# Patient Record
Sex: Female | Born: 1960 | Race: White | Hispanic: No | Marital: Married | Smoking: Never smoker
Health system: Southern US, Community
[De-identification: ages and names within clinical notes are randomized; demographics above are authoritative.]

## PROBLEM LIST (undated history)

## (undated) HISTORY — PX: AUGMENTATION MAMMAPLASTY: SUR837

---

## 1999-11-10 ENCOUNTER — Other Ambulatory Visit: Admission: RE | Admit: 1999-11-10 | Discharge: 1999-11-10 | Payer: Self-pay | Admitting: Obstetrics and Gynecology

## 1999-12-10 ENCOUNTER — Encounter: Payer: Self-pay | Admitting: Obstetrics and Gynecology

## 1999-12-10 ENCOUNTER — Encounter: Admission: RE | Admit: 1999-12-10 | Discharge: 1999-12-10 | Payer: Self-pay | Admitting: Obstetrics and Gynecology

## 2000-11-02 ENCOUNTER — Inpatient Hospital Stay (HOSPITAL_COMMUNITY): Admission: AD | Admit: 2000-11-02 | Discharge: 2000-11-04 | Payer: Self-pay | Admitting: Obstetrics and Gynecology

## 2000-12-01 ENCOUNTER — Other Ambulatory Visit: Admission: RE | Admit: 2000-12-01 | Discharge: 2000-12-01 | Payer: Self-pay | Admitting: Obstetrics and Gynecology

## 2002-03-27 ENCOUNTER — Other Ambulatory Visit: Admission: RE | Admit: 2002-03-27 | Discharge: 2002-03-27 | Payer: Self-pay | Admitting: Obstetrics and Gynecology

## 2002-07-03 ENCOUNTER — Other Ambulatory Visit: Admission: RE | Admit: 2002-07-03 | Discharge: 2002-07-03 | Payer: Self-pay | Admitting: Obstetrics and Gynecology

## 2003-05-16 ENCOUNTER — Other Ambulatory Visit: Admission: RE | Admit: 2003-05-16 | Discharge: 2003-05-16 | Payer: Self-pay | Admitting: Obstetrics and Gynecology

## 2003-09-12 ENCOUNTER — Other Ambulatory Visit: Admission: RE | Admit: 2003-09-12 | Discharge: 2003-09-12 | Payer: Self-pay | Admitting: Obstetrics and Gynecology

## 2004-11-19 ENCOUNTER — Ambulatory Visit: Payer: Self-pay

## 2007-08-16 ENCOUNTER — Ambulatory Visit: Payer: Self-pay

## 2008-12-09 ENCOUNTER — Ambulatory Visit: Payer: Self-pay | Admitting: Obstetrics and Gynecology

## 2010-04-13 ENCOUNTER — Ambulatory Visit: Payer: Self-pay | Admitting: Obstetrics and Gynecology

## 2011-09-16 ENCOUNTER — Ambulatory Visit: Payer: Self-pay | Admitting: Obstetrics and Gynecology

## 2011-09-26 ENCOUNTER — Ambulatory Visit: Payer: Self-pay | Admitting: Obstetrics and Gynecology

## 2013-01-12 DIAGNOSIS — E538 Deficiency of other specified B group vitamins: Secondary | ICD-10-CM | POA: Insufficient documentation

## 2013-02-13 ENCOUNTER — Ambulatory Visit: Payer: Self-pay | Admitting: Obstetrics and Gynecology

## 2014-07-17 ENCOUNTER — Ambulatory Visit: Payer: Self-pay | Admitting: Obstetrics and Gynecology

## 2015-08-07 ENCOUNTER — Other Ambulatory Visit: Payer: Self-pay | Admitting: Obstetrics and Gynecology

## 2015-08-07 DIAGNOSIS — Z780 Asymptomatic menopausal state: Secondary | ICD-10-CM

## 2015-08-07 DIAGNOSIS — Z13820 Encounter for screening for osteoporosis: Secondary | ICD-10-CM

## 2015-08-07 DIAGNOSIS — Z1231 Encounter for screening mammogram for malignant neoplasm of breast: Secondary | ICD-10-CM

## 2015-08-27 ENCOUNTER — Ambulatory Visit
Admission: RE | Admit: 2015-08-27 | Discharge: 2015-08-27 | Disposition: A | Discharge status: Home / Self Care | Payer: BLUE CROSS/BLUE SHIELD | Source: Ambulatory Visit | Attending: Obstetrics and Gynecology | Admitting: Obstetrics and Gynecology

## 2015-08-27 ENCOUNTER — Other Ambulatory Visit: Payer: Self-pay | Admitting: Obstetrics and Gynecology

## 2015-08-27 DIAGNOSIS — Z9882 Breast implant status: Secondary | ICD-10-CM | POA: Diagnosis not present

## 2015-08-27 DIAGNOSIS — Z13820 Encounter for screening for osteoporosis: Secondary | ICD-10-CM

## 2015-08-27 DIAGNOSIS — Z780 Asymptomatic menopausal state: Secondary | ICD-10-CM

## 2015-08-27 DIAGNOSIS — Z1231 Encounter for screening mammogram for malignant neoplasm of breast: Secondary | ICD-10-CM | POA: Diagnosis not present

## 2016-02-11 DIAGNOSIS — E034 Atrophy of thyroid (acquired): Secondary | ICD-10-CM | POA: Insufficient documentation

## 2016-02-11 DIAGNOSIS — E785 Hyperlipidemia, unspecified: Secondary | ICD-10-CM | POA: Insufficient documentation

## 2016-12-08 ENCOUNTER — Other Ambulatory Visit: Payer: Self-pay | Admitting: Obstetrics and Gynecology

## 2017-04-04 DIAGNOSIS — E041 Nontoxic single thyroid nodule: Secondary | ICD-10-CM | POA: Insufficient documentation

## 2018-07-11 ENCOUNTER — Other Ambulatory Visit: Payer: Self-pay | Admitting: Internal Medicine

## 2018-07-11 DIAGNOSIS — Z1231 Encounter for screening mammogram for malignant neoplasm of breast: Secondary | ICD-10-CM

## 2018-07-27 ENCOUNTER — Ambulatory Visit
Admission: RE | Admit: 2018-07-27 | Discharge: 2018-07-27 | Disposition: A | Discharge status: Home / Self Care | Payer: BLUE CROSS/BLUE SHIELD | Source: Ambulatory Visit | Attending: Internal Medicine | Admitting: Internal Medicine

## 2018-07-27 ENCOUNTER — Encounter: Payer: Self-pay | Admitting: Radiology

## 2018-07-27 DIAGNOSIS — Z1231 Encounter for screening mammogram for malignant neoplasm of breast: Secondary | ICD-10-CM | POA: Diagnosis present

## 2018-07-31 ENCOUNTER — Inpatient Hospital Stay
Admission: RE | Admit: 2018-07-31 | Discharge: 2018-07-31 | Disposition: A | Discharge status: Home / Self Care | Payer: Self-pay | Source: Ambulatory Visit | Attending: *Deleted | Admitting: *Deleted

## 2018-07-31 ENCOUNTER — Other Ambulatory Visit: Payer: Self-pay | Admitting: *Deleted

## 2018-07-31 DIAGNOSIS — Z9289 Personal history of other medical treatment: Secondary | ICD-10-CM

## 2019-10-24 ENCOUNTER — Other Ambulatory Visit: Payer: Self-pay | Admitting: Obstetrics and Gynecology

## 2019-12-11 ENCOUNTER — Other Ambulatory Visit: Payer: Self-pay | Admitting: Obstetrics and Gynecology

## 2019-12-11 DIAGNOSIS — Z1231 Encounter for screening mammogram for malignant neoplasm of breast: Secondary | ICD-10-CM

## 2020-01-09 ENCOUNTER — Ambulatory Visit
Admission: RE | Admit: 2020-01-09 | Discharge: 2020-01-09 | Disposition: A | Discharge status: Home / Self Care | Payer: BLUE CROSS/BLUE SHIELD | Source: Ambulatory Visit | Attending: Obstetrics and Gynecology | Admitting: Obstetrics and Gynecology

## 2020-01-09 DIAGNOSIS — Z1231 Encounter for screening mammogram for malignant neoplasm of breast: Secondary | ICD-10-CM | POA: Insufficient documentation

## 2020-01-14 ENCOUNTER — Other Ambulatory Visit: Payer: Self-pay | Admitting: Obstetrics and Gynecology

## 2020-01-14 DIAGNOSIS — R928 Other abnormal and inconclusive findings on diagnostic imaging of breast: Secondary | ICD-10-CM

## 2020-01-14 DIAGNOSIS — N6310 Unspecified lump in the right breast, unspecified quadrant: Secondary | ICD-10-CM

## 2020-01-24 ENCOUNTER — Ambulatory Visit
Admission: RE | Admit: 2020-01-24 | Discharge: 2020-01-24 | Disposition: A | Discharge status: Home / Self Care | Payer: Self-pay | Source: Ambulatory Visit | Attending: Obstetrics and Gynecology | Admitting: Obstetrics and Gynecology

## 2020-01-24 DIAGNOSIS — N6310 Unspecified lump in the right breast, unspecified quadrant: Secondary | ICD-10-CM

## 2020-01-24 DIAGNOSIS — R928 Other abnormal and inconclusive findings on diagnostic imaging of breast: Secondary | ICD-10-CM

## 2020-07-15 ENCOUNTER — Other Ambulatory Visit: Payer: Self-pay | Admitting: Dermatology

## 2020-07-15 ENCOUNTER — Other Ambulatory Visit: Payer: Self-pay

## 2020-07-15 ENCOUNTER — Ambulatory Visit (INDEPENDENT_AMBULATORY_CARE_PROVIDER_SITE_OTHER): Payer: Self-pay | Admitting: Dermatology

## 2020-07-15 DIAGNOSIS — L738 Other specified follicular disorders: Secondary | ICD-10-CM

## 2020-07-15 DIAGNOSIS — I8310 Varicose veins of unspecified lower extremity with inflammation: Secondary | ICD-10-CM

## 2020-07-15 DIAGNOSIS — Z1283 Encounter for screening for malignant neoplasm of skin: Secondary | ICD-10-CM

## 2020-07-15 DIAGNOSIS — D485 Neoplasm of uncertain behavior of skin: Secondary | ICD-10-CM

## 2020-07-15 DIAGNOSIS — L821 Other seborrheic keratosis: Secondary | ICD-10-CM

## 2020-07-15 DIAGNOSIS — D1800 Hemangioma unspecified site: Secondary | ICD-10-CM

## 2020-07-15 DIAGNOSIS — L578 Other skin changes due to chronic exposure to nonionizing radiation: Secondary | ICD-10-CM

## 2020-07-15 DIAGNOSIS — D229 Melanocytic nevi, unspecified: Secondary | ICD-10-CM

## 2020-07-15 DIAGNOSIS — L814 Other melanin hyperpigmentation: Secondary | ICD-10-CM

## 2020-07-15 NOTE — Progress Notes (Deleted)
Follow-Up Visit   Subjective  Deborah Macias is a 59 y.o. female who presents for the following: Annual Exam. The patient presents for Total-Body Skin Exam (TBSE) for skin cancer screening and mole check.  The following portions of the chart were reviewed this encounter and updated as appropriate:  Allergies  Meds  Problems  Med Hx  Surg Hx  Fam Hx     Review of Systems:  No other skin or systemic complaints except as noted in HPI or Assessment and Plan.  Objective  Well appearing patient in no apparent distress; mood and affect are within normal limits.  A full examination was performed including scalp, head, eyes, ears, nose, lips, neck, chest, axillae, abdomen, back, buttocks, bilateral upper extremities, bilateral lower extremities, hands, feet, fingers, toes, fingernails, and toenails. All findings within normal limits unless otherwise noted below.  Objective  Face: Small yellow papules with a central dell.   Objective  LUQA: 0.4 cm Firm pink/brown papulenodule with dimple sign.   Assessment & Plan  Sebaceous hyperplasia Face Benign, observe. Discussed fee of $60 to treat first lesion and $15 for each there after.    Neoplasm of uncertain behavior of skin LUQA  Skin / nail biopsy Type of biopsy: punch   Informed consent: discussed and consent obtained   Timeout: patient name, date of birth, surgical site, and procedure verified   Procedure prep:  Patient was prepped and draped in usual sterile fashion (the patient was cleaned and prepped) Prep type:  Isopropyl alcohol Anesthesia: the lesion was anesthetized in a standard fashion   Anesthetic:  1% lidocaine w/ epinephrine 1-100,000 buffered w/ 8.4% NaHCO3 Punch size:  3 mm Suture size:  4-0 Suture type: nylon   Hemostasis achieved with: suture, pressure and aluminum chloride   Outcome: patient tolerated procedure well   Post-procedure details: sterile dressing applied and wound care instructions given     Dressing type: bandage, petrolatum and pressure dressing    Specimen 1 - Surgical pathology Differential Diagnosis: D48.5 r/o dermatofibroma vs other Check Margins: No Firm pink/brown papulenodule with dimple sign.  Granulomatous tick bite reaction versus dermatofibroma from tick bite - Discussed punch excision if bothersome.  Punch excision performed today   Lentigines - Scattered tan macules - Discussed due to sun exposure - Benign, observe - Call for any changes  Seborrheic Keratoses - Stuck-on, waxy, tan-brown papules and plaques  - Discussed benign etiology and prognosis. - Observe - Call for any changes  Melanocytic Nevi - Tan-brown and/or pink-flesh-colored symmetric macules and papules - Benign appearing on exam today - Observation - Call clinic for new or changing moles - Recommend daily use of broad spectrum spf 30+ sunscreen to sun-exposed areas.   Hemangiomas - Red papules - Discussed benign nature - Observe - Call for any changes  Actinic Damage - diffuse scaly erythematous macules with underlying dyspigmentation - Recommend daily broad spectrum sunscreen SPF 30+ to sun-exposed areas, reapply every 2 hours as needed.  - Call for new or changing lesions.  Spider Veins - Dilated blue, purple or red veins at the lower extremities - Reassured - These can be treated by sclerotherapy (a procedure to inject a medicine into the veins to make them disappear) if desired, but the treatment is not covered by insurance  Skin cancer screening performed today.  Return in about 1 year (around 07/15/2021) for TBSE.  Luther Redo, CMA, am acting as scribe for Sarina Ser, MD .  Documentation: I have reviewed  the above documentation for accuracy and completeness, and I agree with the above.  Sarina Ser, MD

## 2020-07-15 NOTE — Patient Instructions (Signed)

## 2020-07-20 ENCOUNTER — Encounter: Payer: Self-pay | Admitting: Dermatology

## 2020-07-22 ENCOUNTER — Other Ambulatory Visit: Payer: Self-pay

## 2020-07-22 ENCOUNTER — Telehealth: Payer: Self-pay

## 2020-07-22 IMAGING — MG MM DIGITAL DIAGNOSTIC UNILAT*R* IMPLANT W/ TOMO W/ CAD
5 series · 6 of 13 positions shown · non-contrast
Comparison: 01/09/2020 and multiple prior studies including
02/13/2013

CLINICAL DATA: Patient returns after screening study for evaluation
of a possible RIGHT breast mass

EXAM:
DIGITAL DIAGNOSTIC RIGHT MAMMOGRAM WITH IMPLANTS AND TOMO
ULTRASOUND RIGHT BREAST
The patient has retropectoral implants. Standard and implant
displaced views were performed.

[R ML]
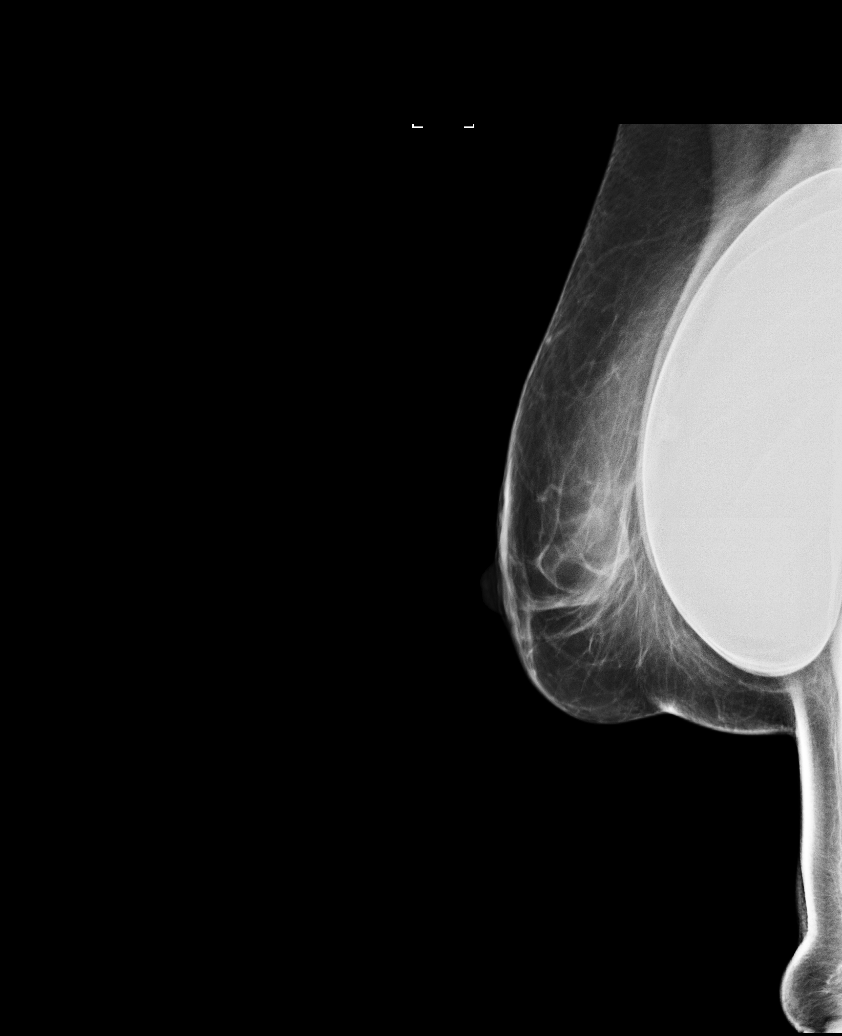

[R ML synth-2D]
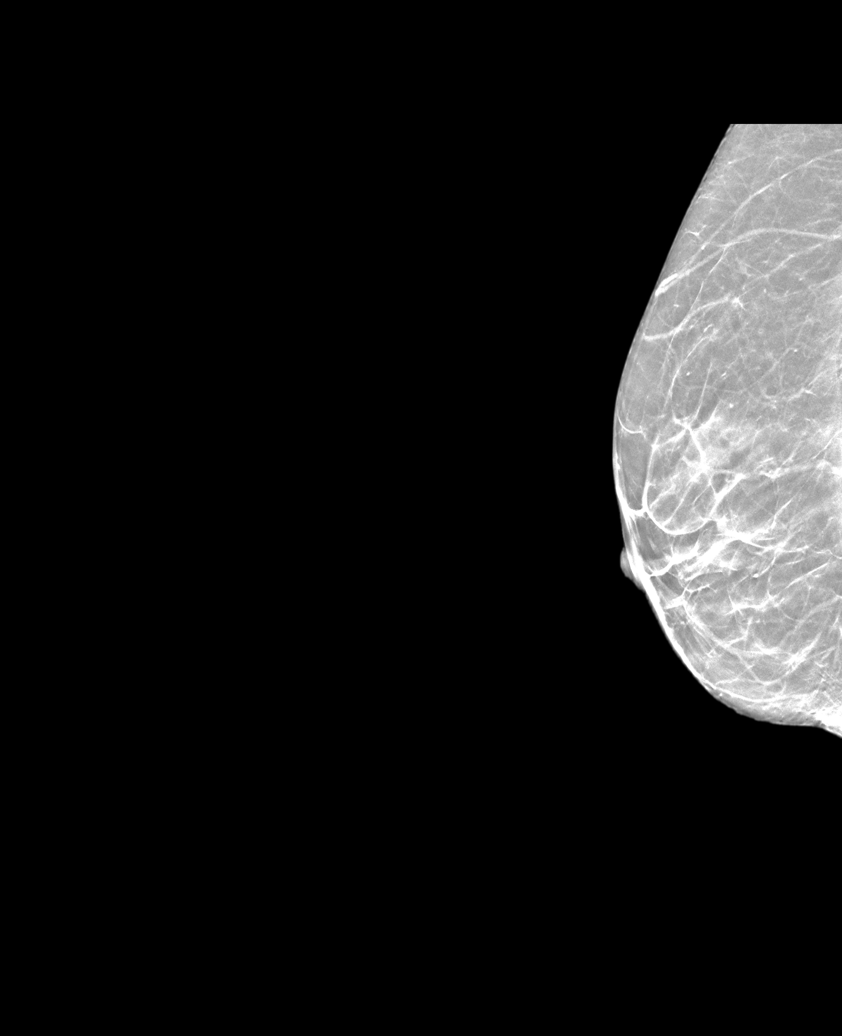

[R CC synth-2D]
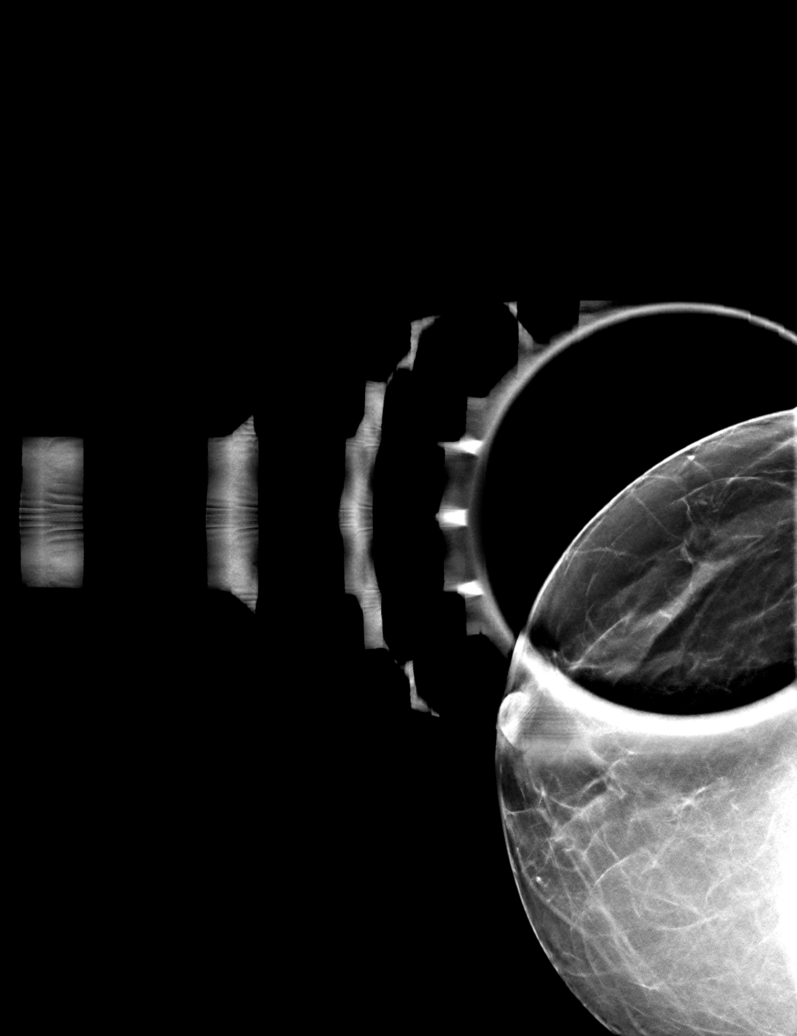

[R CCID BREAST TOMOSYNTHESIS IMAGE tomo · 2 of 38 frames shown]
[frame 13/38]
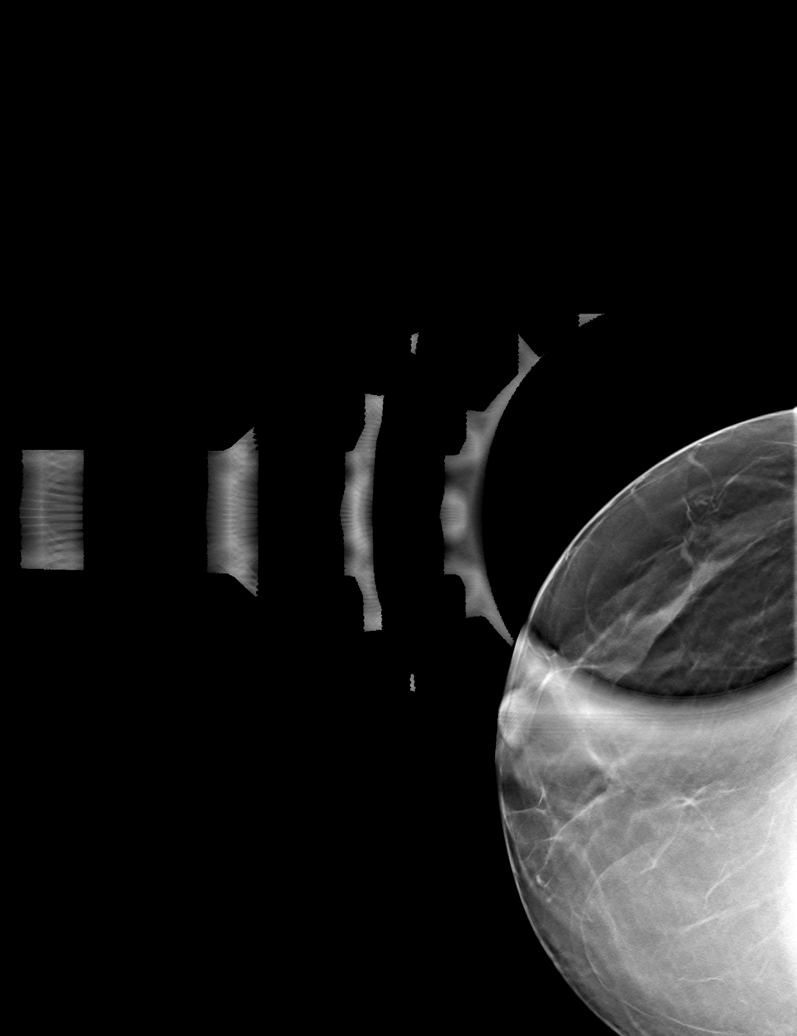
[frame 19/38]
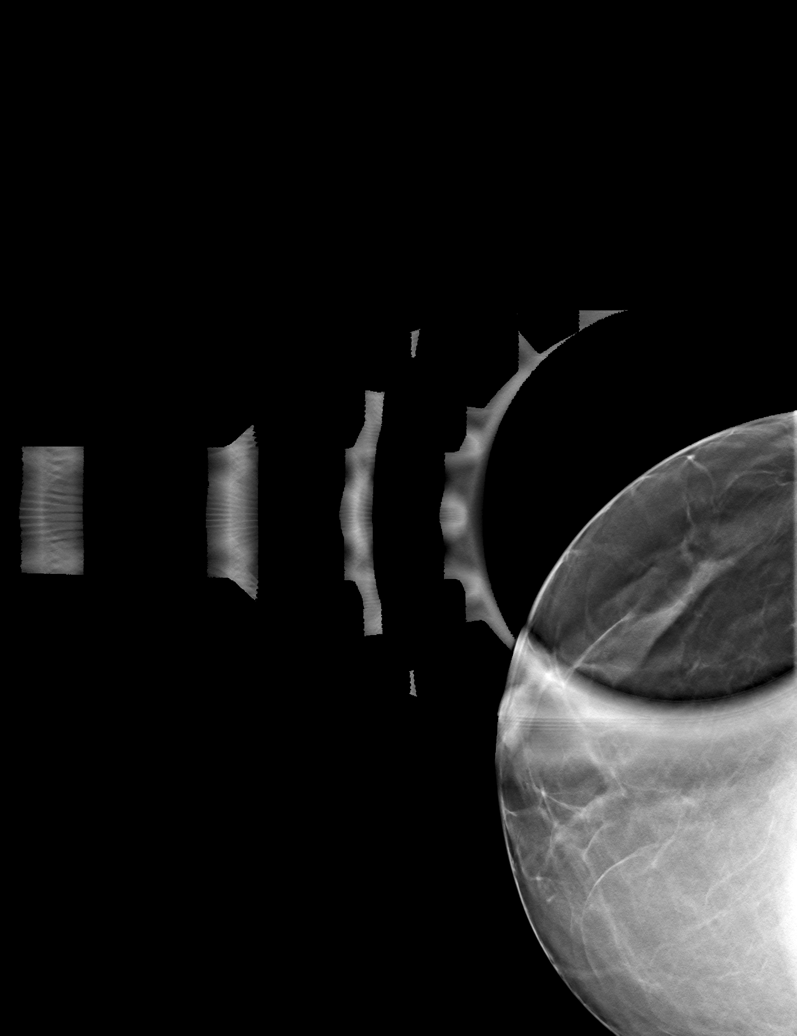

[R MLID BREAST TOMOSYNTHESIS IMAGE tomo · tomo slice 23/46.0]
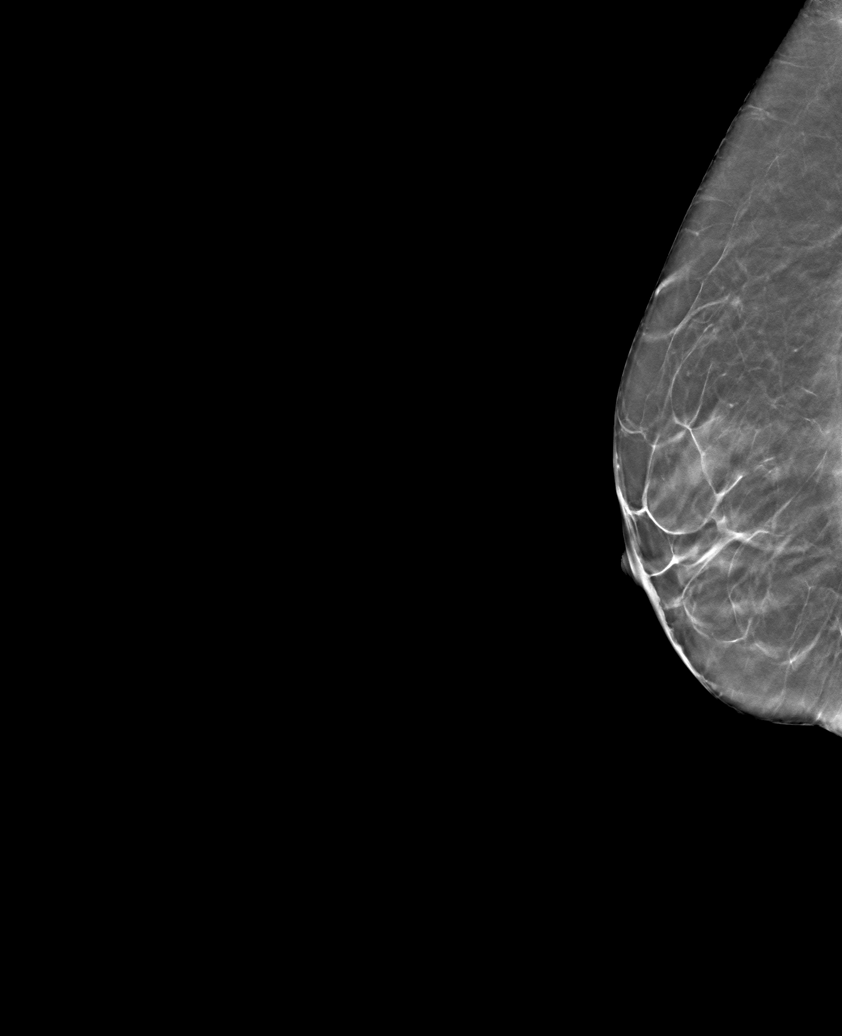

[6 of 13 positions shown; findings below may reference images not displayed]

ACR Breast Density Category b: There are scattered areas of
fibroglandular density.
FINDINGS: Additional 2-D and 3-D images are performed. These views confirm
presence of an oval circumscribed mass with associated notch in the
LATERAL portion of the RIGHT breast. Morphology is consistent with
intramammary lymph node. No suspicious mass, distortion, or
microcalcifications.

On physical exam, I palpate no abnormality in the LATERAL aspect of
the RIGHT breast.

Targeted ultrasound is performed, showing a small hypoechoic nodule
in the 9 o'clock location of the RIGHT breast 4 centimeters from the
nipple which measures 3 millimeters in diameter. This correlates
well with the mammographic appearance and is consistent with a small
intramammary lymph node. No other suspicious abnormalities
identified in the LATERAL quadrants of the RIGHT breast. Note is
made of retropectoral implant.
IMPRESSION: Benign intramammary lymph node in the LATERAL portion of the RIGHT
breast. No mammographic or ultrasound evidence for malignancy.

RECOMMENDATION:
Screening mammogram in one year.(Code:7Y-0-C39)

I have discussed the findings and recommendations with the patient.
If applicable, a reminder letter will be sent to the patient
regarding the next appointment.

BI-RADS CATEGORY  2: Benign.

## 2020-07-22 IMAGING — US US BREAST*R* LIMITED INC AXILLA
1 series · 8 of 8 positions shown · non-contrast
Comparison: 01/09/2020 and multiple prior studies including
02/13/2013

CLINICAL DATA: Patient returns after screening study for evaluation
of a possible RIGHT breast mass

EXAM:
DIGITAL DIAGNOSTIC RIGHT MAMMOGRAM WITH IMPLANTS AND TOMO
ULTRASOUND RIGHT BREAST
The patient has retropectoral implants. Standard and implant
displaced views were performed.

[Series 1: us breast*right* limited inc axilla · 0.04mm/px · 8 of 8 slices shown]
[im 1/8]
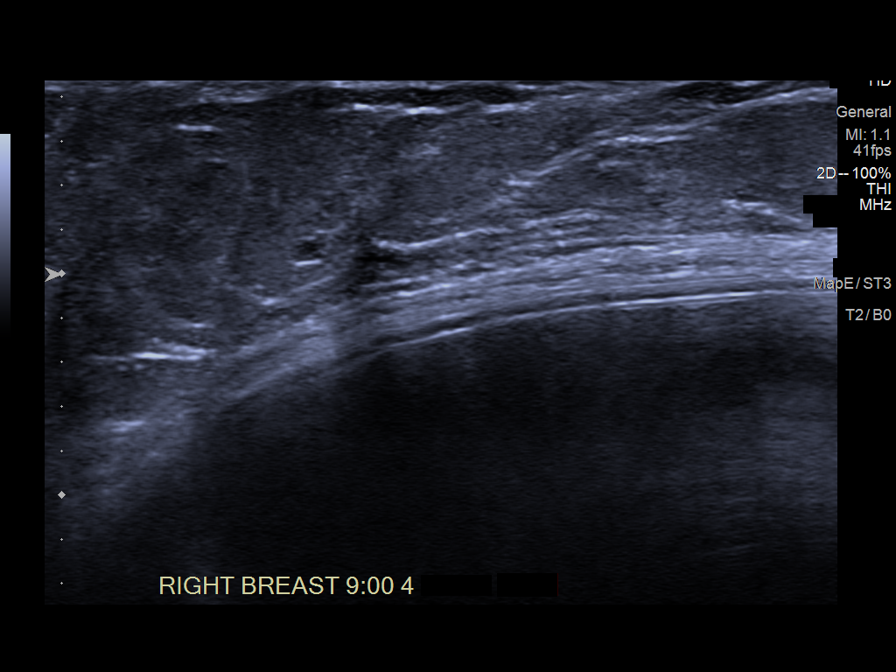
[im 2/8]
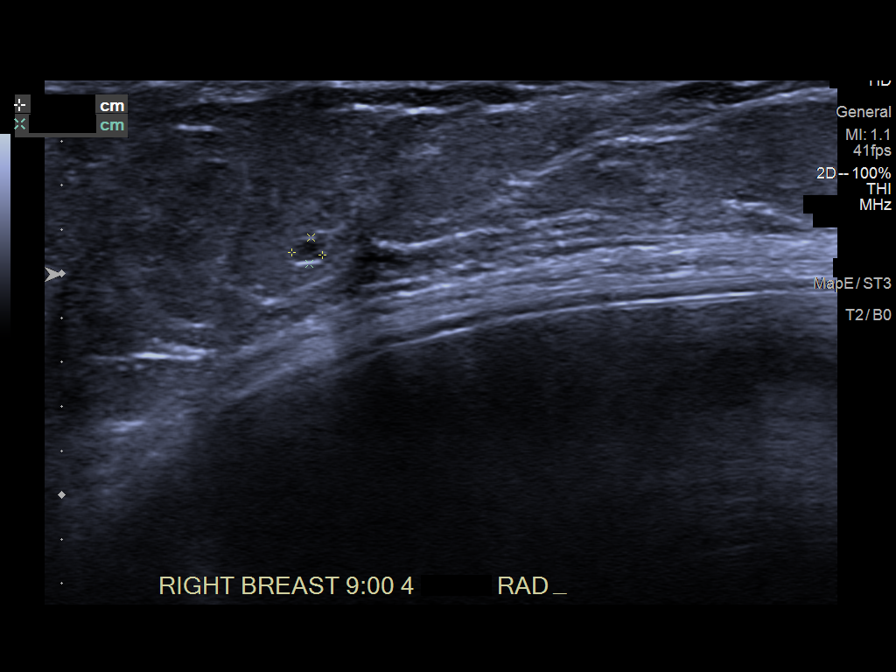
[im 3/8]
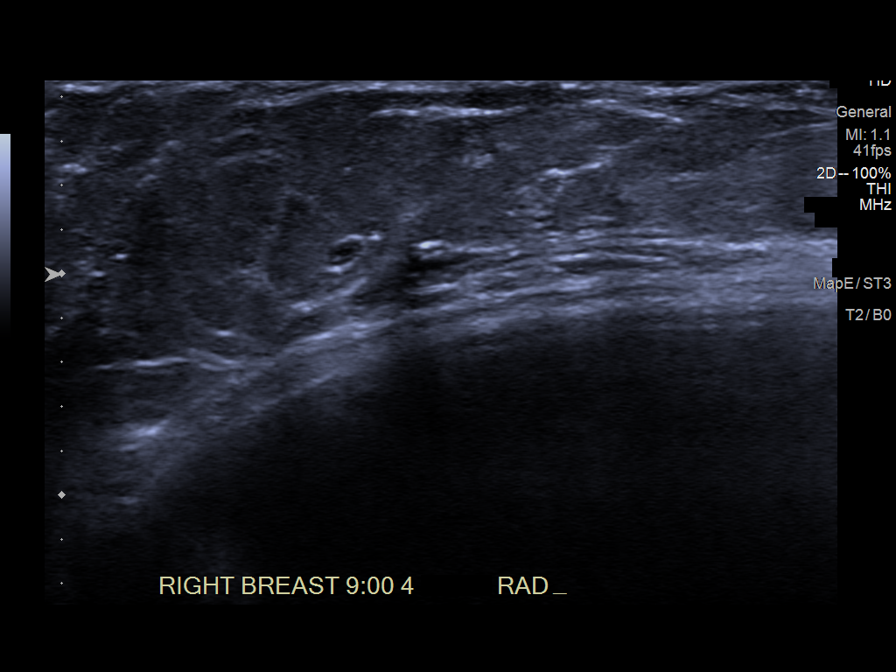
[im 4/8]
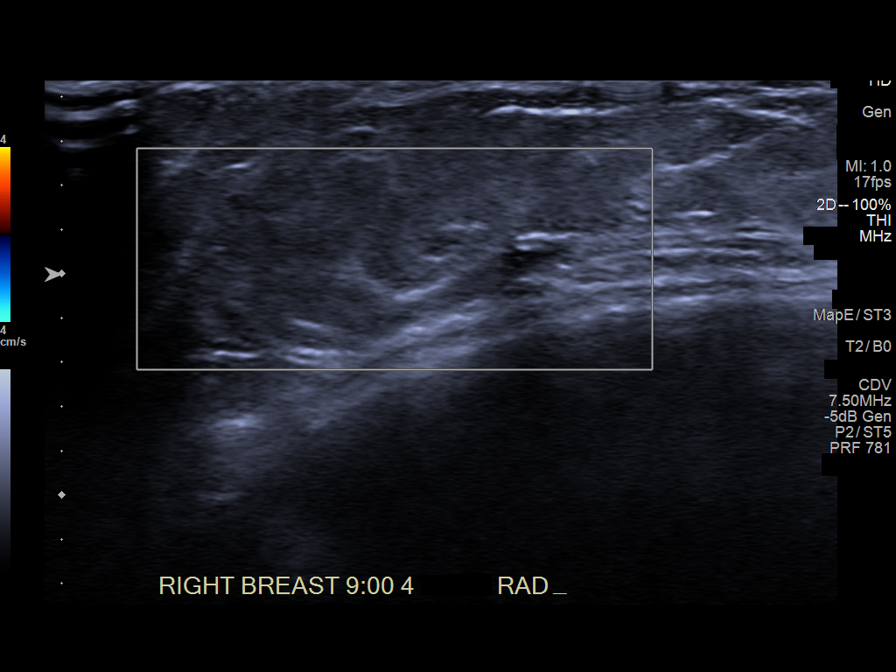
[im 5/8]
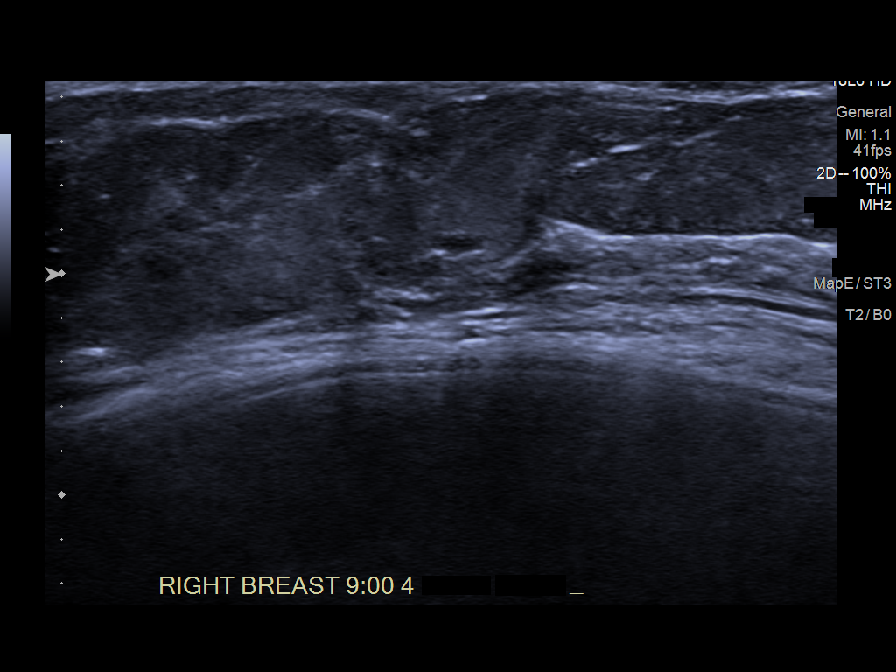
[im 6/8]
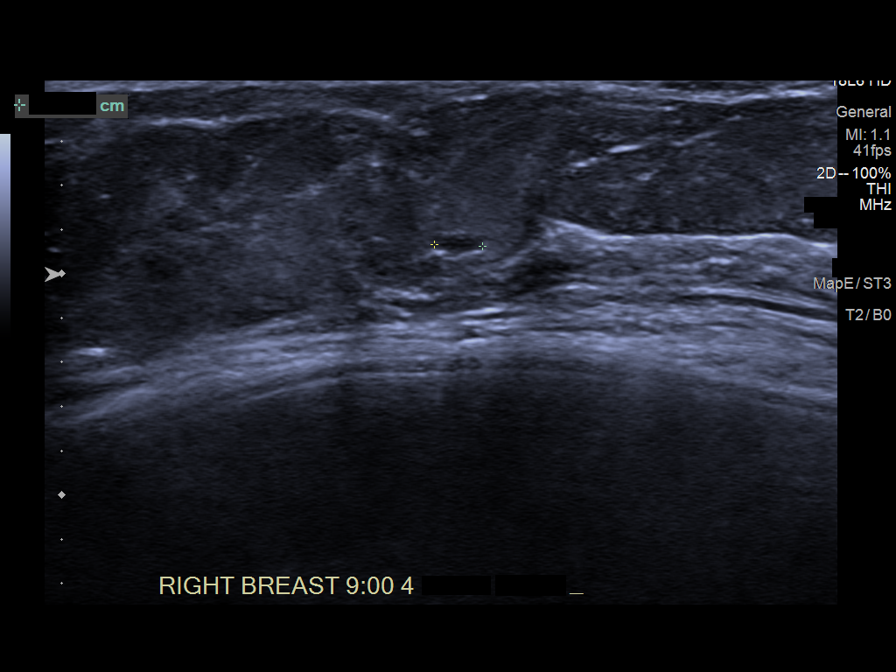
[im 7/8]
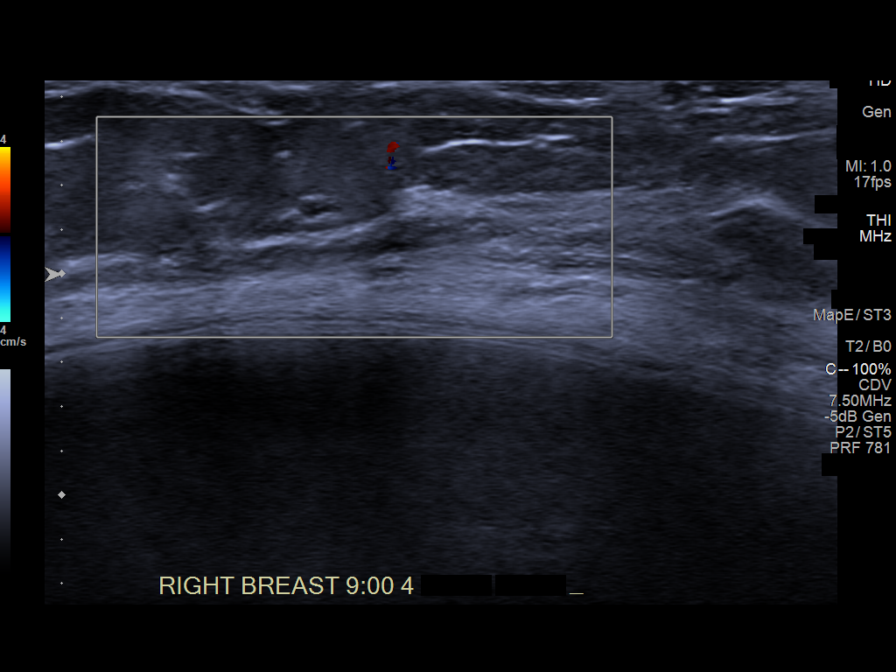
[im 8/8]
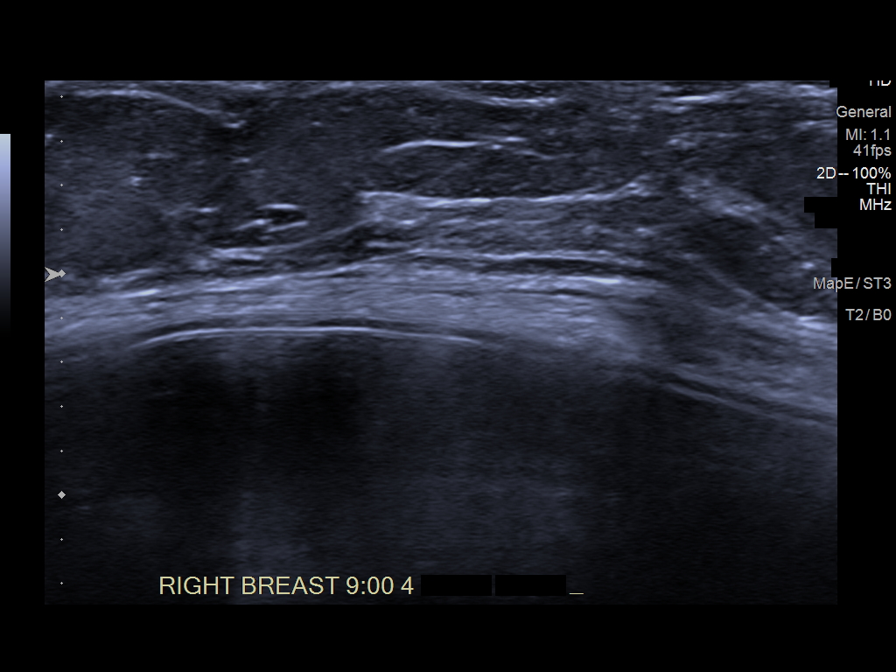

[8 of 8 positions shown; findings below may reference images not displayed]

ACR Breast Density Category b: There are scattered areas of
fibroglandular density.
FINDINGS: Additional 2-D and 3-D images are performed. These views confirm
presence of an oval circumscribed mass with associated notch in the
LATERAL portion of the RIGHT breast. Morphology is consistent with
intramammary lymph node. No suspicious mass, distortion, or
microcalcifications.

On physical exam, I palpate no abnormality in the LATERAL aspect of
the RIGHT breast.

Targeted ultrasound is performed, showing a small hypoechoic nodule
in the 9 o'clock location of the RIGHT breast 4 centimeters from the
nipple which measures 3 millimeters in diameter. This correlates
well with the mammographic appearance and is consistent with a small
intramammary lymph node. No other suspicious abnormalities
identified in the LATERAL quadrants of the RIGHT breast. Note is
made of retropectoral implant.
IMPRESSION: Benign intramammary lymph node in the LATERAL portion of the RIGHT
breast. No mammographic or ultrasound evidence for malignancy.

RECOMMENDATION:
Screening mammogram in one year.(Code:7Y-0-C39)

I have discussed the findings and recommendations with the patient.
If applicable, a reminder letter will be sent to the patient
regarding the next appointment.

BI-RADS CATEGORY  2: Benign.

## 2020-07-22 NOTE — Telephone Encounter (Signed)
Opened in error

## 2020-07-22 NOTE — Telephone Encounter (Signed)
Left message on patient's voicemail to return my call with any questions she may have regarding MyChart pathology results. If no questions will see her at her next visit.

## 2020-07-22 NOTE — Progress Notes (Signed)
Follow-Up Visit   Subjective  Deborah Macias is a 59 y.o. female who presents for the following: Annual Exam. Patient has noticed a lesion on her face that she would like checked and treated today. She has also noticed a lesion on her LUQA where she was previously bit by a tick that is bothersome.  The following portions of the chart were reviewed this encounter and updated as appropriate:  Allergies  Meds  Problems  Med Hx  Surg Hx  Fam Hx     Review of Systems:  No other skin or systemic complaints except as noted in HPI or Assessment and Plan.  Objective  Well appearing patient in no apparent distress; mood and affect are within normal limits.  A full examination was performed including scalp, head, eyes, ears, nose, lips, neck, chest, axillae, abdomen, back, buttocks, bilateral upper extremities, bilateral lower extremities, hands, feet, fingers, toes, fingernails, and toenails. All findings within normal limits unless otherwise noted below.  Objective  Face: Small yellow papules with a central dell.   Objective  LUQA: 0.4 cm Firm pink/brown papulenodule with dimple sign.    Assessment & Plan  Sebaceous hyperplasia Face Benign, observe. Discussed fee of $60 to treat first lesion and $15 for each there after. Pt desires treatment of 1 spot.  Destruction of lesion - Face Complexity: simple   Destruction method comment:  Electrodesiccation performed today Informed consent: discussed and consent obtained   Timeout:  patient name, date of birth, surgical site, and procedure verified Procedure prep:  Patient was prepped and draped in usual sterile fashion Prep type:  Isopropyl alcohol Hemostasis achieved with:  electrodesiccation Outcome: patient tolerated procedure well with no complications    Neoplasm of uncertain behavior of skin LUQA  Skin / nail biopsy Type of biopsy: punch   Informed consent: discussed and consent obtained   Timeout: patient name, date of  birth, surgical site, and procedure verified   Procedure prep:  Patient was prepped and draped in usual sterile fashion (the patient was cleaned and prepped) Prep type:  Isopropyl alcohol Anesthesia: the lesion was anesthetized in a standard fashion   Anesthetic:  1% lidocaine w/ epinephrine 1-100,000 buffered w/ 8.4% NaHCO3 Punch size:  3 mm Suture size:  4-0 Suture type: nylon   Hemostasis achieved with: suture, pressure and aluminum chloride   Outcome: patient tolerated procedure well   Post-procedure details: sterile dressing applied and wound care instructions given   Dressing type: bandage, petrolatum and pressure dressing    Specimen 1 - Surgical pathology Differential Diagnosis: D48.5 r/o dermatofibroma vs other Check Margins: No Firm pink/brown papulenodule with dimple sign.  Granulomatous tick bite reaction versus dermatofibroma from tick bite - Discussed punch excision if bothersome - performed today  Lentigines - Scattered tan macules - Discussed due to sun exposure - Benign, observe - Call for any changes  Seborrheic Keratoses - Stuck-on, waxy, tan-brown papules and plaques  - Discussed benign etiology and prognosis. - Observe - Call for any changes  Melanocytic Nevi - Tan-brown and/or pink-flesh-colored symmetric macules and papules - Benign appearing on exam today - Observation - Call clinic for new or changing moles - Recommend daily use of broad spectrum spf 30+ sunscreen to sun-exposed areas.   Hemangiomas - Red papules - Discussed benign nature - Observe - Call for any changes  Actinic Damage - diffuse scaly erythematous macules with underlying dyspigmentation - Recommend daily broad spectrum sunscreen SPF 30+ to sun-exposed areas, reapply every 2 hours  as needed.  - Call for new or changing lesions.  Spider Veins - Dilated blue, purple or red veins at the lower extremities - Reassured - These can be treated by sclerotherapy (a procedure to  inject a medicine into the veins to make them disappear) if desired, but the treatment is not covered by insurance  Skin cancer screening performed today.  Return in about 1 year (around 07/15/2021) for TBSE.  Luther Redo, CMA, am acting as scribe for Sarina Ser, MD .  Documentation: I have reviewed the above documentation for accuracy and completeness, and I agree with the above.  Sarina Ser, MD

## 2020-07-22 NOTE — Telephone Encounter (Signed)
-----   Message from Ralene Bathe, MD sent at 07/21/2020  7:45 PM EDT ----- Skin , (A) LUQA MIXED DERMAL LYMPHOCYTIC AND EOSINOPHILIC INFILTRATE  Benign inflammation consistent with bite reaction Known tick bite in this area No further treatment at this time

## 2020-07-27 ENCOUNTER — Ambulatory Visit (INDEPENDENT_AMBULATORY_CARE_PROVIDER_SITE_OTHER): Payer: Self-pay | Admitting: Dermatology

## 2020-07-27 ENCOUNTER — Encounter: Payer: Self-pay | Admitting: Dermatology

## 2020-07-27 ENCOUNTER — Other Ambulatory Visit: Payer: Self-pay

## 2020-07-27 DIAGNOSIS — W57XXXD Bitten or stung by nonvenomous insect and other nonvenomous arthropods, subsequent encounter: Secondary | ICD-10-CM

## 2020-07-27 DIAGNOSIS — Z4802 Encounter for removal of sutures: Secondary | ICD-10-CM

## 2020-07-27 DIAGNOSIS — S30861D Insect bite (nonvenomous) of abdominal wall, subsequent encounter: Secondary | ICD-10-CM

## 2020-07-27 NOTE — Progress Notes (Signed)
   Follow-Up Visit   Subjective  Deborah Macias is a 59 y.o. female who presents for the following: suture removal/pathology results (patient is here today to discuss patholoy results and to have her suture removed).  The following portions of the chart were reviewed this encounter and updated as appropriate:  Allergies  Meds  Problems  Med Hx  Surg Hx  Fam Hx     Review of Systems:  No other skin or systemic complaints except as noted in HPI or Assessment and Plan.  Objective  Well appearing patient in no apparent distress; mood and affect are within normal limits.  A focused examination was performed including the abdomen. Relevant physical exam findings are noted in the Assessment and Plan.  Objective  Left Abdomen (side) - Upper: Healing punch excision site  Assessment & Plan  Tick bite of abdomen, subsequent encounter Left Abdomen (side) - Upper  Encounter for Removal of Sutures - Incision site at the [place] is clean, dry and intact - Wound cleansed, sutures removed, wound cleansed and steri strips applied.  - Discussed pathology results showing benign tick bite - Patient advised to keep steri-strips dry until they fall off. - Scars remodel for a full year. - Once steri-strips fall off, patient can apply over-the-counter silicone scar cream each night to help with scar remodeling if desired. - Patient advised to call with any concerns or if they notice any new or changing lesions.   Return for appointment as scheduled.  Luther Redo, CMA, am acting as scribe for Sarina Ser, MD .  Documentation: I have reviewed the above documentation for accuracy and completeness, and I agree with the above.  Sarina Ser, MD

## 2020-10-22 ENCOUNTER — Other Ambulatory Visit: Payer: Self-pay | Admitting: Obstetrics and Gynecology

## 2020-10-22 DIAGNOSIS — Z1231 Encounter for screening mammogram for malignant neoplasm of breast: Secondary | ICD-10-CM

## 2020-12-25 ENCOUNTER — Other Ambulatory Visit (HOSPITAL_COMMUNITY): Payer: Self-pay | Admitting: Internal Medicine

## 2020-12-25 ENCOUNTER — Ambulatory Visit
Admission: RE | Admit: 2020-12-25 | Discharge: 2020-12-25 | Disposition: A | Discharge status: Home / Self Care | Payer: Self-pay | Source: Ambulatory Visit | Attending: Internal Medicine | Admitting: Internal Medicine

## 2020-12-25 ENCOUNTER — Other Ambulatory Visit: Payer: Self-pay | Admitting: Internal Medicine

## 2020-12-25 ENCOUNTER — Other Ambulatory Visit: Payer: Self-pay

## 2020-12-25 DIAGNOSIS — S0990XA Unspecified injury of head, initial encounter: Secondary | ICD-10-CM | POA: Insufficient documentation

## 2020-12-25 DIAGNOSIS — G44319 Acute post-traumatic headache, not intractable: Secondary | ICD-10-CM | POA: Insufficient documentation

## 2021-01-12 DIAGNOSIS — I471 Supraventricular tachycardia, unspecified: Secondary | ICD-10-CM | POA: Insufficient documentation

## 2021-06-23 IMAGING — CT CT CERVICAL SPINE W/O CM
4 of 5 series · 13 of 33 positions shown, 15 images · non-contrast
Comparison: No pertinent prior exams available for comparison.

CLINICAL DATA: Traumatic injury of head, initial encounter. Acute
posttraumatic headache, not intractable. Additional history
provided: Patient reports boxes fell on her on 12/19/2020 with
subsequent fall hitting head, now with severe headaches and tingling
and scalp, right-sided neck pain, pain radiates into right shoulder.

EXAM:
CT HEAD WITHOUT CONTRAST
CT CERVICAL SPINE WITHOUT CONTRAST
TECHNIQUE: Multidetector CT imaging of the head and cervical spine was
performed following the standard protocol without intravenous
contrast. Multiplanar CT image reconstructions of the cervical spine
were also generated.

[Series 2: axial bone c-spine 2.00 · axial · 0.33mm/px · z∈[-639,-563]mm · 3 of 76 slices shown, 4 images]
[im 19/76  soft-tissue]
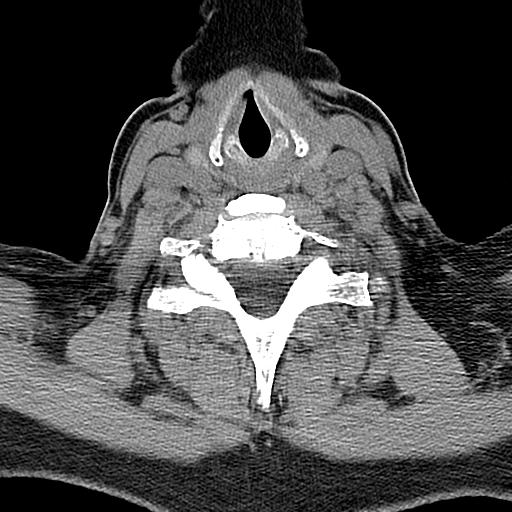
[im 19/76  bone]
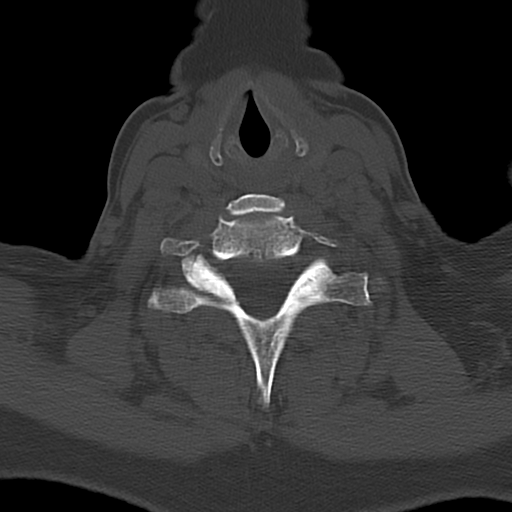
[im 38/76  bone]
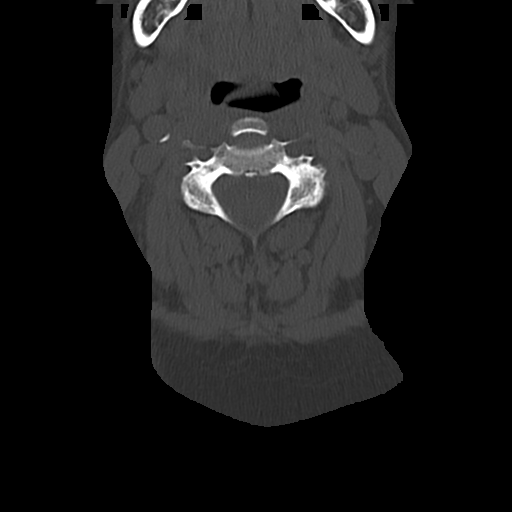
[im 57/76  bone]
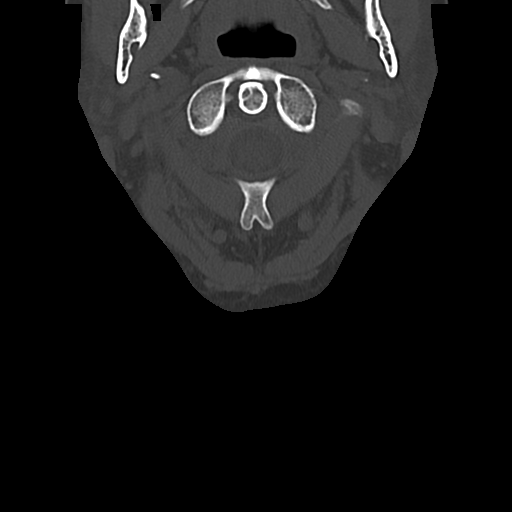

[Series 4: sag bone c-spine 2.00 sag · sagittal · 0.33mm/px · 5 of 61 slices shown, 6 images]
[im 21/61  bone]
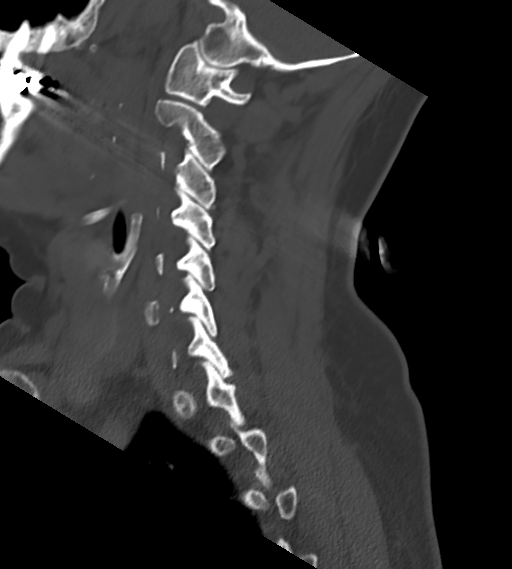
[im 26/61  bone]
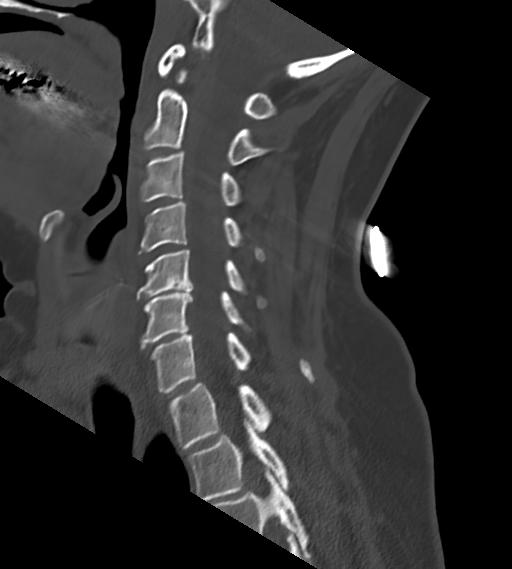
[im 31/61  soft-tissue]
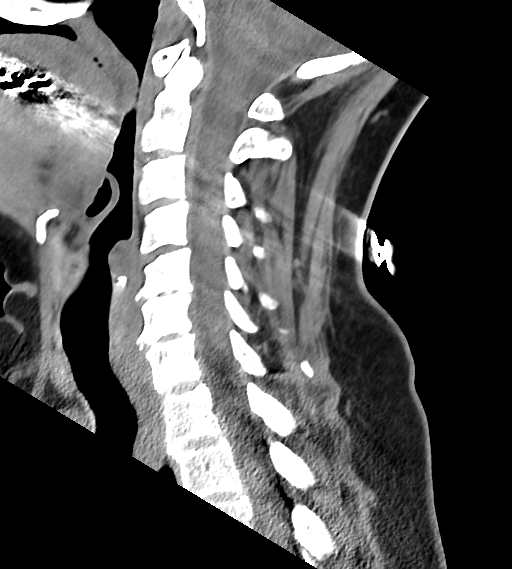
[im 31/61  bone]
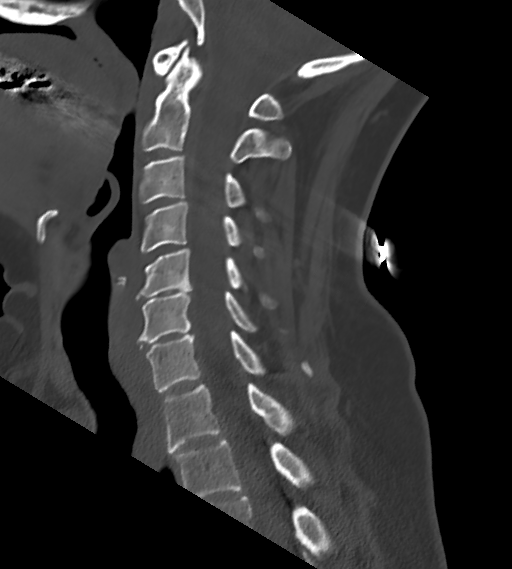
[im 36/61  bone]
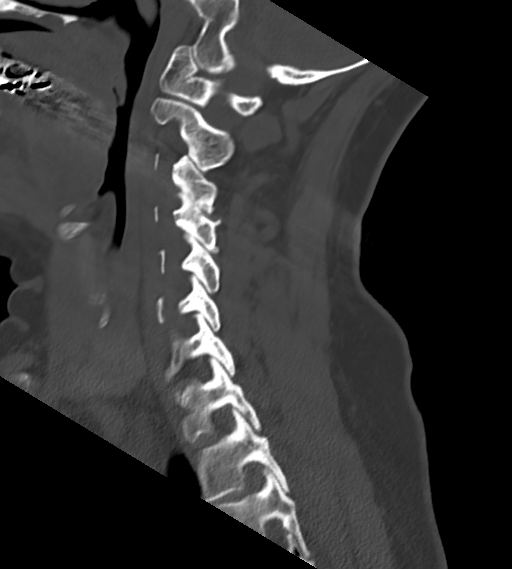
[im 41/61  bone]
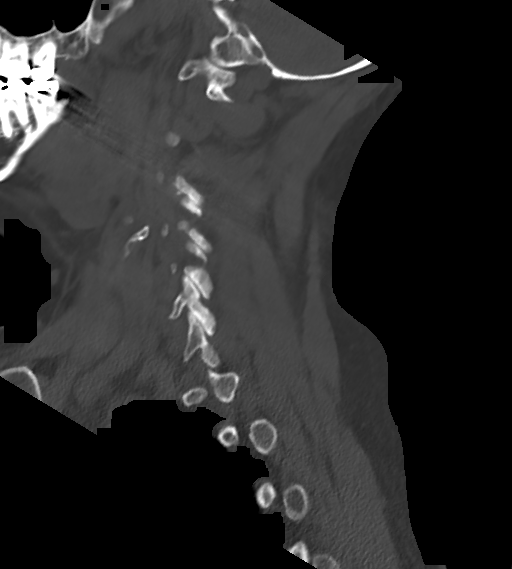

[Series 6: cor bone c-spine 2.00 cor · coronal · 0.24mm/px · 3 of 85 slices shown]
[im 28/85  bone]
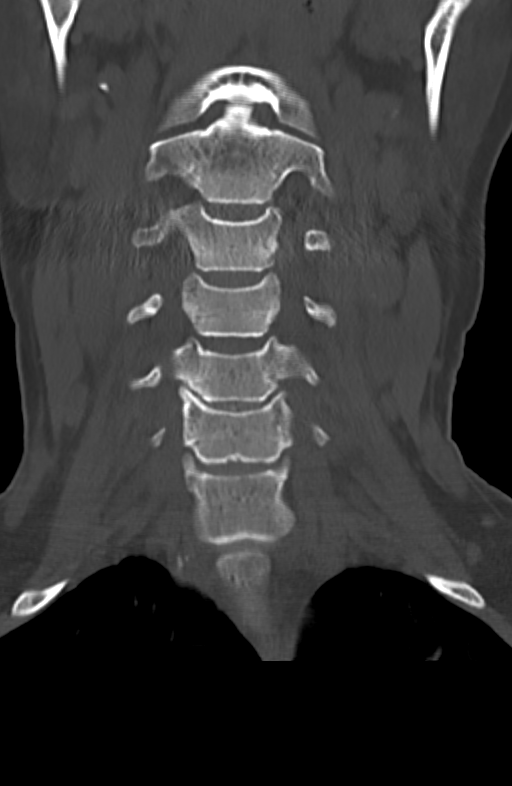
[im 38/85  bone]
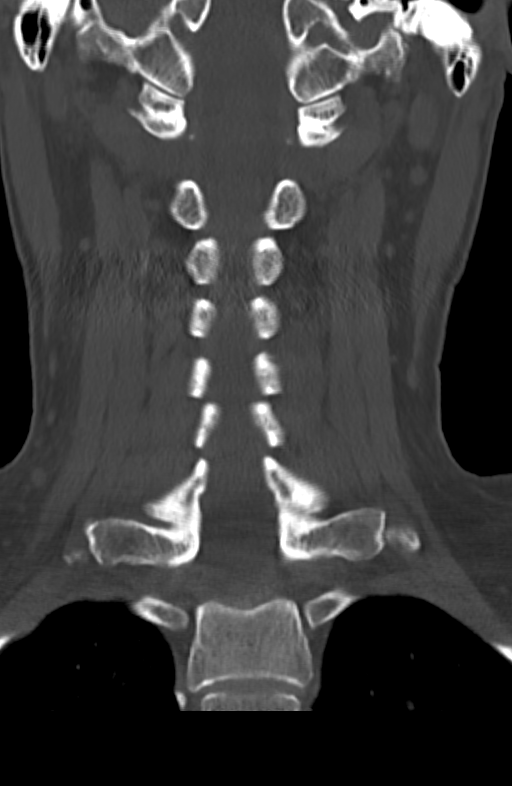
[im 47/85  bone]
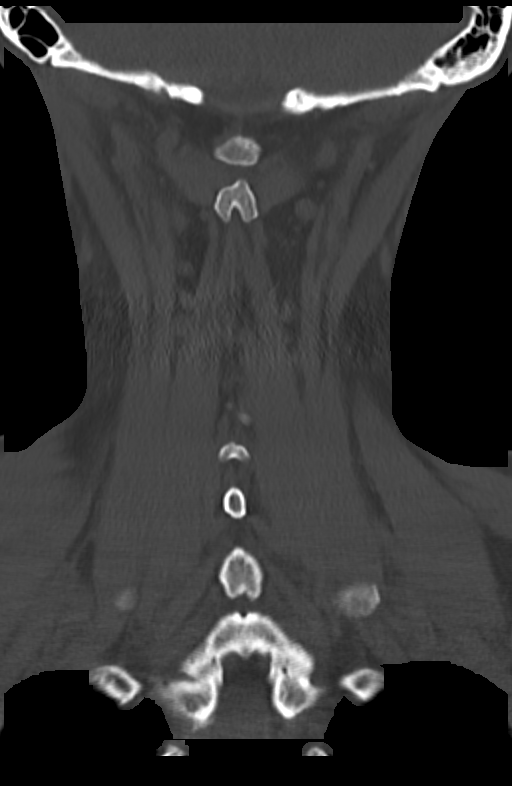

[Series 8: orthogonal axial c-spine 2.00 ax · axial · 0.24mm/px · z∈[-691,-651]mm · 2 of 94 slices shown]
[im 24/94  bone]
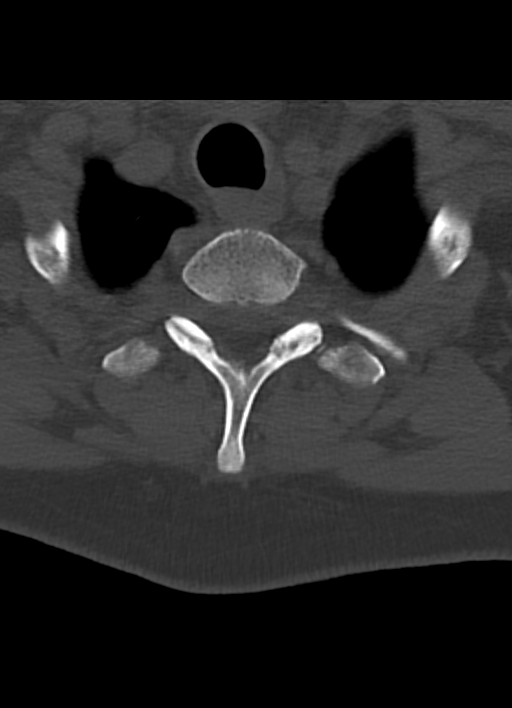
[im 47/94  bone]
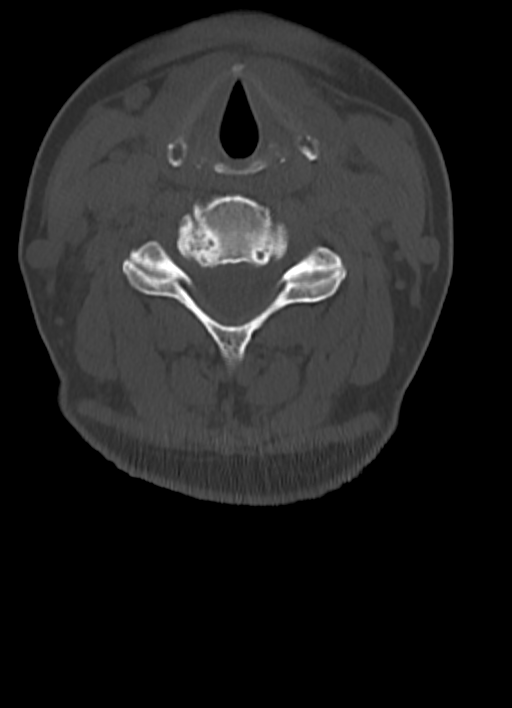

[13 of 33 positions shown; findings below may reference images not displayed]

FINDINGS: CT HEAD FINDINGS

Brain:

Cerebral volume is normal for age.

There is no acute intracranial hemorrhage.

No demarcated cortical infarct.

No extra-axial fluid collection.

No evidence of intracranial mass.

No midline shift.

Partially empty sella turcica.

Vascular: No hyperdense vessel.  Atherosclerotic calcifications

Skull: Normal. Negative for fracture or focal lesion.

Sinuses/Orbits: Visualized orbits show no acute finding. Small left
sphenoid sinus mucous retention cyst.

CT CERVICAL SPINE FINDINGS

Alignment: Straightening of the expected cervical lordosis. 2 mm
C3-C4 and C4-C5 grade 1 anterolisthesis. Trace C7-T1 grade 1
anterolisthesis.

Skull base and vertebrae: The basion-dental and atlanto-dental
intervals are maintained.No evidence of acute fracture to the
cervical spine.

Soft tissues and spinal canal: No prevertebral fluid or swelling. No
visible canal hematoma.

Disc levels: Cervical spondylosis with multilevel disc space
narrowing, disc bulges, uncovertebral hypertrophy and facet
arthrosis. Disc space narrowing is moderate/advanced at C5-C6 and
C6-C7. Uncovertebral hypertrophy contributes to bilateral C5-C6 and
C6-C7 bony neural foraminal narrowing.

Upper chest: No consolidation within the imaged lung apices. No
visible pneumothorax.

Other: Subcentimeter left thyroid lobe nodule not meeting consensus
criteria for ultrasound follow-up.
IMPRESSION: CT head:

1. No evidence of acute intracranial abnormality.
2. Partially empty sella turcica. This finding is very commonly
incidental, but can be associated with idiopathic intracranial
hypertension.
3. Small left sphenoid sinus mucous retention cyst.

CT cervical spine:

1. No evidence of acute fracture to the cervical spine.
2. Two mm C3-C4 and C4-C5 grade 1 anterolisthesis.
3. Trace C7-T1 grade 1 anterolisthesis.
4. Cervical spondylosis as described and greatest at C5-C6 and
C6-C7.

## 2021-07-15 ENCOUNTER — Encounter: Payer: Self-pay | Admitting: Dermatology

## 2021-08-17 ENCOUNTER — Other Ambulatory Visit: Payer: Self-pay | Admitting: Obstetrics and Gynecology

## 2021-08-17 DIAGNOSIS — Z1231 Encounter for screening mammogram for malignant neoplasm of breast: Secondary | ICD-10-CM

## 2021-08-31 ENCOUNTER — Other Ambulatory Visit: Payer: Self-pay

## 2021-08-31 ENCOUNTER — Ambulatory Visit
Admission: RE | Admit: 2021-08-31 | Discharge: 2021-08-31 | Disposition: A | Discharge status: Home / Self Care | Payer: Self-pay | Source: Ambulatory Visit | Attending: Obstetrics and Gynecology | Admitting: Obstetrics and Gynecology

## 2021-08-31 DIAGNOSIS — Z1231 Encounter for screening mammogram for malignant neoplasm of breast: Secondary | ICD-10-CM | POA: Insufficient documentation

## 2021-09-16 ENCOUNTER — Ambulatory Visit (INDEPENDENT_AMBULATORY_CARE_PROVIDER_SITE_OTHER): Payer: Self-pay | Admitting: Dermatology

## 2021-09-16 ENCOUNTER — Other Ambulatory Visit: Payer: Self-pay

## 2021-09-16 DIAGNOSIS — L821 Other seborrheic keratosis: Secondary | ICD-10-CM

## 2021-09-16 DIAGNOSIS — L820 Inflamed seborrheic keratosis: Secondary | ICD-10-CM

## 2021-09-16 DIAGNOSIS — L918 Other hypertrophic disorders of the skin: Secondary | ICD-10-CM

## 2021-09-16 DIAGNOSIS — L578 Other skin changes due to chronic exposure to nonionizing radiation: Secondary | ICD-10-CM

## 2021-09-16 DIAGNOSIS — D1801 Hemangioma of skin and subcutaneous tissue: Secondary | ICD-10-CM

## 2021-09-16 DIAGNOSIS — L814 Other melanin hyperpigmentation: Secondary | ICD-10-CM

## 2021-09-16 DIAGNOSIS — Z1283 Encounter for screening for malignant neoplasm of skin: Secondary | ICD-10-CM

## 2021-09-16 DIAGNOSIS — D229 Melanocytic nevi, unspecified: Secondary | ICD-10-CM

## 2021-09-16 MED ORDER — MUPIROCIN 2 % EX OINT: 1.0000 "application " | TOPICAL_OINTMENT | Freq: Two times a day (BID) | CUTANEOUS | 1 refills | Status: DC

## 2021-09-16 NOTE — Patient Instructions (Signed)

## 2021-09-16 NOTE — Progress Notes (Signed)
Follow-Up Visit   Subjective  Deborah Macias is a 60 y.o. female who presents for the following: Annual Exam (No history of skin cancer - TBSE today). She complains of irritating lesions on her leg and neck.  The one on her leg has been traumatized by shaving and has been scabbed.  The following portions of the chart were reviewed this encounter and updated as appropriate:   Allergies  Meds  Problems  Med Hx  Surg Hx  Fam Hx     Review of Systems:  No other skin or systemic complaints except as noted in HPI or Assessment and Plan.  Objective  Well appearing patient in no apparent distress; mood and affect are within normal limits.  A full examination was performed including scalp, head, eyes, ears, nose, lips, neck, chest, axillae, abdomen, back, buttocks, bilateral upper extremities, bilateral lower extremities, hands, feet, fingers, toes, fingernails, and toenails. All findings within normal limits unless otherwise noted below.  Left lat lower leg x 1, right leg x 4, left leg x 4, ant neck x 1 (10) 1.0 x 0.7 cm crust of left lat lower leg. Erythematous keratotic or waxy stuck-on papule of right lat lower leg, ant neck        Assessment & Plan   Acrochordons (Skin Tags) - Fleshy, skin-colored pedunculated papules - Benign appearing.  - Observe. - If desired, they can be removed with an in office procedure that is not covered by insurance. - Please call the clinic if you notice any new or changing lesions.   Lentigines - Scattered tan macules - Due to sun exposure - Benign-appearing, observe - Recommend daily broad spectrum sunscreen SPF 30+ to sun-exposed areas, reapply every 2 hours as needed. - Call for any changes  Seborrheic Keratoses - Stuck-on, waxy, tan-brown papules and/or plaques  - Benign-appearing - Discussed benign etiology and prognosis. - Observe - Call for any changes  Melanocytic Nevi - Tan-brown and/or pink-flesh-colored symmetric  macules and papules - Benign appearing on exam today - Observation - Call clinic for new or changing moles - Recommend daily use of broad spectrum spf 30+ sunscreen to sun-exposed areas.   Hemangiomas - Red papules - Discussed benign nature - Observe - Call for any changes  Actinic Damage - Chronic condition, secondary to cumulative UV/sun exposure - diffuse scaly erythematous macules with underlying dyspigmentation - Recommend daily broad spectrum sunscreen SPF 30+ to sun-exposed areas, reapply every 2 hours as needed.  - Staying in the shade or wearing long sleeves, sun glasses (UVA+UVB protection) and wide brim hats (4-inch brim around the entire circumference of the hat) are also recommended for sun protection.  - Call for new or changing lesions.  Skin cancer screening performed today.  Inflamed seborrheic keratosis Left lat lower leg x 1, right leg x 4, left leg x 4, ant neck x 1  RTC if lesion of left lat lower leg is not resolved after 2 months  Destruction of lesion - Left lat lower leg x 1, right leg x 4, left leg x 4, ant neck x 1 Complexity: simple   Destruction method: cryotherapy   Informed consent: discussed and consent obtained   Timeout:  patient name, date of birth, surgical site, and procedure verified Lesion destroyed using liquid nitrogen: Yes   Region frozen until ice ball extended beyond lesion: Yes   Outcome: patient tolerated procedure well with no complications   Post-procedure details: wound care instructions given    mupirocin ointment (  BACTROBAN) 2 % - Left lat lower leg x 1, right leg x 4, left leg x 4, ant neck x 1 Apply 1 application topically 2 (two) times daily.  Return in about 1 year (around 09/16/2022) for TBSE.  I, Ashok Cordia, CMA, am acting as scribe for Sarina Ser, MD . Documentation: I have reviewed the above documentation for accuracy and completeness, and I agree with the above.  Sarina Ser, MD

## 2021-09-17 ENCOUNTER — Encounter: Payer: Self-pay | Admitting: Dermatology

## 2021-12-09 DIAGNOSIS — M5090 Cervical disc disorder, unspecified, unspecified cervical region: Secondary | ICD-10-CM | POA: Insufficient documentation

## 2022-03-29 ENCOUNTER — Encounter: Payer: Self-pay | Admitting: Emergency Medicine

## 2022-03-29 ENCOUNTER — Other Ambulatory Visit: Payer: Self-pay

## 2022-03-29 ENCOUNTER — Emergency Department
Admission: EM | Admit: 2022-03-29 | Discharge: 2022-03-30 | Disposition: A | Discharge status: Home / Self Care | Payer: Self-pay | Attending: Emergency Medicine | Admitting: Emergency Medicine

## 2022-03-29 DIAGNOSIS — I471 Supraventricular tachycardia: Secondary | ICD-10-CM | POA: Insufficient documentation

## 2022-03-29 LAB — COMPREHENSIVE METABOLIC PANEL
ALT: 18 U/L (ref 0–44)
AST: 26 U/L (ref 15–41)
Albumin: 4.2 g/dL (ref 3.5–5.0)
Alkaline Phosphatase: 111 U/L (ref 38–126)
Anion gap: 9 (ref 5–15)
BUN: 32 mg/dL — ABNORMAL HIGH (ref 6–20)
CO2: 24 mmol/L (ref 22–32)
Calcium: 9.8 mg/dL (ref 8.9–10.3)
Chloride: 105 mmol/L (ref 98–111)
Creatinine, Ser: 0.8 mg/dL (ref 0.44–1.00)
GFR, Estimated: >60 mL/min (ref >60)
Glucose, Bld: 137 mg/dL — ABNORMAL HIGH (ref 70–99)
Potassium: 4.8 mmol/L (ref 3.5–5.1)
Sodium: 138 mmol/L (ref 135–145)
Total Bilirubin: 0.4 mg/dL (ref 0.3–1.2)
Total Protein: 7.7 g/dL (ref 6.5–8.1)

## 2022-03-29 LAB — CBC WITH DIFFERENTIAL/PLATELET
Abs Immature Granulocytes: 0.02 10*3/uL (ref 0.00–0.07)
Basophils Absolute: 0 10*3/uL (ref 0.0–0.1)
Basophils Relative: 0 %
Eosinophils Absolute: 0.1 10*3/uL (ref 0.0–0.5)
Eosinophils Relative: 1 %
HCT: 42.2 % (ref 36.0–46.0)
Hemoglobin: 14.2 g/dL (ref 12.0–15.0)
Immature Granulocytes: 0 %
Lymphocytes Relative: 40 %
Lymphs Abs: 3.7 10*3/uL (ref 0.7–4.0)
MCH: 31.4 pg (ref 26.0–34.0)
MCHC: 33.6 g/dL (ref 30.0–36.0)
MCV: 93.4 fL (ref 80.0–100.0)
Monocytes Absolute: 0.7 10*3/uL (ref 0.1–1.0)
Monocytes Relative: 8 %
Neutro Abs: 4.8 10*3/uL (ref 1.7–7.7)
Neutrophils Relative %: 51 %
Platelets: 274 10*3/uL (ref 150–400)
RBC: 4.52 MIL/uL (ref 3.87–5.11)
RDW: 12.4 % (ref 11.5–15.5)
WBC: 9.3 10*3/uL (ref 4.0–10.5)
nRBC: 0 % (ref 0.0–0.2)

## 2022-03-29 LAB — MAGNESIUM: Magnesium: 2.8 mg/dL — ABNORMAL HIGH (ref 1.7–2.4)

## 2022-03-29 LAB — CBG MONITORING, ED: Glucose-Capillary: 132 mg/dL — ABNORMAL HIGH (ref 70–99)

## 2022-03-29 LAB — TSH: TSH: 2.206 u[IU]/mL (ref 0.350–4.500)

## 2022-03-29 MED ORDER — ADENOSINE 6 MG/2ML IV SOLN
INTRAVENOUS | Status: AC
  Administered 2022-03-29: 6 mg via INTRAVENOUS
  Filled 2022-03-29: qty 2

## 2022-03-29 MED ORDER — SODIUM CHLORIDE 0.9 % IV BOLUS
1000.0000 mL | Freq: Once | INTRAVENOUS | Status: AC
  Administered 2022-03-29: 1000 mL via INTRAVENOUS

## 2022-03-29 MED ORDER — ADENOSINE 12 MG/4ML IV SOLN
INTRAVENOUS | Status: AC
  Filled 2022-03-29: qty 4

## 2022-03-29 NOTE — ED Provider Notes (Signed)
? ?[facility] ?Provider Note ? ? ? None  ?  (approximate) ? ? ?History  ? ?Tachycardia ? ? ?HPI ? ?Deborah Macias is a 61 y.o. female with reportedly a past medical history of palpitations previously evaluated Holter monitor although patient states she is never had SVT presents for evaluation of palpitations and weakness as well as some soreness in her left chest that started around 7:30 PM today.  Patient states he was in her usual state of health before this without any recent fevers, cough, vomiting, diarrhea, urinary symptoms, back pain or any other recent chest pain.  No new medications supplements, ethanol, tobacco or any illicit drugs.  She states she has had several episodes like this going back several months where her Apple Watch will say her heart rate is in the 200s and she typically will eat something because she is worried her sugar is low and this will resolve her tachycardia.  No other acute concerns at this time. ?  ?History reviewed. No pertinent past medical history. ? ? ?Physical Exam  ?Triage Vital Signs: ?ED Triage Vitals [03/29/22 2259]  ?Enc Vitals Group  ?   BP (!) 119/96  ?   Pulse Rate (!) 197  ?   Resp 18  ?   Temp   ?   Temp src   ?   SpO2 97 %  ?   Weight 180 lb (81.6 kg)  ?   Height '5\' 1"'$  (1.549 m)  ?   Head Circumference   ?   Peak Flow   ?   Pain Score   ?   Pain Loc   ?   Pain Edu?   ?   Excl. in [place]?   ? ? ?Most recent vital signs: ?Vitals:  ? 03/29/22 2322 03/29/22 2330  ?BP:  106/80  ?Pulse:  91  ?Resp: 18 18  ?Temp: 97.7 ?F (36.5 ?C)   ?SpO2:  97%  ? ? ?General: Awake, comfortable appearing. ?CV:  Good peripheral perfusion.  2+ radial pulses.  Tachycardic. ?Resp:  Normal effort.  Clear bilaterally ?Abd:  No distention.  Soft. ?Other:   ? ? ?ED Results / Procedures / Treatments  ?Labs ?(all labs ordered are listed, but only abnormal results are displayed) ?Labs Reviewed  ?CBG MONITORING, ED - Abnormal; Notable for the following components:  ?    Result  Value  ? Glucose-Capillary 132 (*)   ? All other components within normal limits  ?CBC WITH DIFFERENTIAL/PLATELET  ?COMPREHENSIVE METABOLIC PANEL  ?MAGNESIUM  ?TSH  ? ? ? ?EKG ? ?ECG is remarkable for SVT with a rate of 196 with some fairly diffuse likely demand related changes throughout with otherwise unremarkable intervals and normal axis. ? ? ?RADIOLOGY ? ? ? ?PROCEDURES: ? ?Critical Care performed: Yes, see critical care procedure note(s) ? ?.Cardioversion ? ?Date/Time: 03/29/2022 11:34 PM ?Performed by: Lucrezia Starch, MD ?Authorized by: Lucrezia Starch, MD  ? ?Consent:  ?  Consent obtained:  Verbal ?  Consent given by:  Patient ?  Risks discussed:  Induced arrhythmia ?  Alternatives discussed:  Delayed treatment ?Pre-procedure details:  ?  Cardioversion basis:  Emergent ?  Rhythm:  Supraventricular tachycardia ?Patient sedated: No ?Post-procedure details:  ?  Patient status:  Awake ?  Patient tolerance of procedure:  Tolerated well, no immediate complications ?Comments:  ?   Patient converted after 6 mg of adenosine provided. ? ?.1-3 Lead EKG Interpretation ?Performed by: Lucrezia Starch, MD ?  Authorized by: Lucrezia Starch, MD  ? ?  Interpretation: non-specific   ?  ECG rate assessment: tachycardic   ?  Rhythm: SVT   ?  Ectopy: none   ?.Critical Care ?Performed by: Lucrezia Starch, MD ?Authorized by: Lucrezia Starch, MD  ? ?Critical care provider statement:  ?  Critical care time (minutes):  30 ?  Critical care was necessary to treat or prevent imminent or life-threatening deterioration of the following conditions:  Cardiac failure ?  Critical care was time spent personally by me on the following activities:  Development of treatment plan with patient or surrogate, discussions with consultants, evaluation of patient's response to treatment, examination of patient, ordering and review of laboratory studies, ordering and review of radiographic studies, ordering and performing treatments and interventions,  pulse oximetry, re-evaluation of patient's condition and review of old charts ? ?The patient is on the cardiac monitor to evaluate for evidence of arrhythmia and/or significant heart rate changes. ? ? ?MEDICATIONS ORDERED IN ED: ?Medications  ?adenosine (ADENOCARD) 12 MG/4ML injection (  Not Given 03/29/22 2337)  ?adenosine (ADENOCARD) 6 MG/2ML injection (6 mg Intravenous Given 03/29/22 2311)  ?sodium chloride 0.9 % bolus 1,000 mL (1,000 mLs Intravenous New Bag/Given 03/29/22 2310)  ? ? ? ?IMPRESSION / MDM / ASSESSMENT AND PLAN / ED COURSE  ?I reviewed the triage vital signs and the nursing notes. ?             ?               ? ?Differential diagnosis includes, but is not limited to SVT secondary to underlying intrinsic conduction pathway disorder, anemia, metabolic derangements, hyperthyroidism with a lower suspicion for toxic ingestion.  Very low suspicion for an occlusion MI or PE at this time. ? ?Attempted vagal maneuvers but these were unsuccessful.  Patient consented for chemical cardioversion and successful conversion achieved after 6 mg of adenosine administered. ? ?ECG is remarkable for SVT with a rate of 196 with some fairly diffuse likely demand related changes throughout with otherwise unremarkable intervals and normal axis. ? ?Post chemical cardioversion ECG is remarkable for sinus tachycardia with a rate of 130 without clear evidence of acute ischemia or significant arrhythmia. ? ?CBC without leukocytosis or acute anemia.  CBG is WNL. ? ?I will plan to check patient's TSH magnesium and a CMP.  Care patient signed over to assuming provider approximately 2200.  Plan is to follow-up with labs and observe patient for couple hours and if she is feeling better and labs are reassuring I think she is likely be stable for discharge with outpatient cardiology follow-up. ?  ? ? ?FINAL CLINICAL IMPRESSION(S) / ED DIAGNOSES  ? ?Final diagnoses:  ?SVT (supraventricular tachycardia) ([place])  ? ? ? ?Rx / DC Orders  ? ?ED  Discharge Orders   ? ?      Ordered  ?  Ambulatory referral to Cardiology       ? 03/29/22 2339  ? ?  ?  ? ?  ? ? ? ?Note:  This document was prepared using Dragon voice recognition software and may include unintentional dictation errors. ?  ?Lucrezia Starch, MD ?03/29/22 2339 ? ?

## 2022-03-29 NOTE — ED Triage Notes (Signed)
Pt arrived via POV with reports of tachycardia, pt states her HR was 185-200 tonight, reports pain across L side of chest and c/o dizziness and lightheadedness, feeling hot and cold. ? ?Pt is alert and oriented on arrival. Pt taken to room 15 after stat registration. ?

## 2022-03-29 NOTE — ED Notes (Addendum)
Pt brought to room 15 and placed on zoll pads at this time. HR is 200. Dr. Tamala Julian at bedside. Adenosine at bedside. Verbal consent for Adenosine obtained  ?  ?

## 2022-03-29 NOTE — Discharge Instructions (Addendum)
Return to the ER for recurrent or worsening symptoms, persistent vomiting, difficulty breathing or other concerns. ?

## 2022-03-30 NOTE — ED Provider Notes (Signed)
----------------------------------------- ?  2:01 AM on 03/30/2022 ?-----------------------------------------  ? ?Patient feeling significantly better.  Laboratory results unremarkable; patient does take magnesium supplement and take 1 prior to arrival.  Will follow up with cardiology as an outpatient.  Strict return precautions given.  Patient and spouse verbalized understanding and agree with plan of care. ?  ?Paulette Blanch, MD ?03/30/22 [phone] ? ?

## 2022-05-10 NOTE — Progress Notes (Signed)
Electrophysiology Office Note:    Date:  05/11/2022   ID:  Deborah Macias, DOB 1961/04/21, MRN [id number]  PCP:  Adin Hector, MD  [facility] Cardiologist:  None  [facility] Electrophysiologist:  Vickie Epley, MD   Referring MD: Lucrezia Starch, MD   Chief Complaint: SVT  History of Present Illness:    Deborah Macias is a 61 y.o. female who presents for an evaluation of SVT at the request of Dr Tamala Julian. Their medical history includes palpitations.  She presented to the ER 03/29/2022 with SVT. When she presented to the ER she reported several months of palpitations with HR > 200 on her apple watch. During the ER visit, '6mg'$  IV adenosine was given which converted her back to sinus rhythm.   She tells me that they occur 1-2 times per year.  Most recently the episode lasted greater than 4 hours and would not improve so she went to the ER where they found her in a heart rate greater than 200 bpm.  This was successfully treated with adenosine.    No past medical history on file.  Past Surgical History:  Procedure Laterality Date   AUGMENTATION MAMMAPLASTY Bilateral    1994 left replaced 99 rt replaced 08    Current Medications: Current Meds  Medication Sig   ascorbic acid (VITAMIN C) 500 MG tablet Take by mouth.   azelastine (ASTELIN) 0.1 % nasal spray Place into the nose as needed.   cetirizine (ZYRTEC) 10 MG tablet Take by mouth as needed.   Cholecalciferol 100 MCG (4000 UT) TABS Take 2 tablets by mouth daily at 6 (six) AM.   EPIFOAM 1-1 % foam Apply topically as needed for hemorrhoids.   liothyronine (CYTOMEL) 5 MCG tablet Take 5 mcg by mouth 2 (two) times daily.   Magnesium Oxide 400 MG CAPS Take by mouth daily at 6 (six) AM.   Zinc 100 MG TABS Take by mouth.     Allergies:   Tizanidine   Social History   Socioeconomic History   Marital status: Married    Spouse name: Not on file   Number of children: Not on file   Years of education: Not on file    Highest education level: Not on file  Occupational History   Not on file  Tobacco Use   Smoking status: Never   Smokeless tobacco: Never  Substance and Sexual Activity   Alcohol use: Never   Drug use: Never   Sexual activity: Yes  Other Topics Concern   Not on file  Social History Narrative   Not on file   Social Determinants of Health   Financial Resource Strain: Not on file  Food Insecurity: Not on file  Transportation Needs: Not on file  Physical Activity: Not on file  Stress: Not on file  Social Connections: Not on file     Family History: The patient's family history includes Breast cancer (age of onset: 60) in her maternal grandmother.  ROS:   Please see the history of present illness.    All other systems reviewed and are negative.  EKGs/Labs/Other Studies Reviewed:    The following studies were reviewed today:  03/29/2022 ECG      Recent Labs: 03/29/2022: ALT 18; BUN 32; Creatinine, Ser 0.80; Hemoglobin 14.2; Magnesium 2.8; Platelets 274; Potassium 4.8; Sodium 138; TSH 2.206  Recent Lipid Panel No results found for: "CHOL", "TRIG", "HDL", "CHOLHDL", "VLDL", "LDLCALC", "LDLDIRECT"  Physical Exam:    VS:  BP 124/80   Pulse 74   Ht '5\' 2"'$  (1.575 m)   Wt 161 lb (73 kg)   BMI 29.45 kg/m     Wt Readings from Last 3 Encounters:  05/11/22 161 lb (73 kg)  03/29/22 180 lb (81.6 kg)     GEN:  Well nourished, well developed in no acute distress HEENT: Normal NECK: No JVD; No carotid bruits LYMPHATICS: No lymphadenopathy CARDIAC: RRR, no murmurs, rubs, gallops RESPIRATORY:  Clear to auscultation without rales, wheezing or rhonchi  ABDOMEN: Soft, non-tender, non-distended MUSCULOSKELETAL:  No edema; No deformity  SKIN: Warm and dry NEUROLOGIC:  Alert and oriented x 3 PSYCHIATRIC:  Normal affect       ASSESSMENT:    1. SVT (supraventricular tachycardia) (HCC)    PLAN:    In order of problems listed above:  #SVT She has a narrow complex  tachycardia with a ventricular rate greater than 200 bpm.  Episodes are random and have no clear triggers.  I discussed treatment options for her SVT during today's visit.  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 tach.  I discussed using medications or EP study with ablation to manage her arrhythmia.  She would like to discuss the options with her husband who is not with her today.  She will let us know how she would like to proceed.     Total time spent with patient today 45 minutes. This includes reviewing records, evaluating the patient and coordinating care.  Medication Adjustments/Labs and Tests Ordered: Current medicines are reviewed at length with the patient today.  Concerns regarding medicines are outlined above.  No orders of the defined types were placed in this encounter.  No orders of the defined types were placed in this encounter.    Signed, Hilton Cork. Quentin Ore, MD, [facility], [facility] 05/11/2022 10:08 AM    Electrophysiology [facility]

## 2022-05-11 ENCOUNTER — Ambulatory Visit (INDEPENDENT_AMBULATORY_CARE_PROVIDER_SITE_OTHER): Payer: Self-pay | Admitting: Cardiology

## 2022-05-11 ENCOUNTER — Encounter: Payer: Self-pay | Admitting: Cardiology

## 2022-05-11 VITALS — BP 124/80 | HR 74 | Ht 62.0 in | Wt 161.0 lb

## 2022-05-11 DIAGNOSIS — I471 Supraventricular tachycardia: Secondary | ICD-10-CM

## 2022-05-11 NOTE — Patient Instructions (Addendum)
Medications: Your physician recommends that you continue on your current medications as directed. Please refer to the Current Medication list given to you today. *If you need a refill on your cardiac medications before your next appointment, please call your pharmacy*  Lab Work: None. If you have labs (blood work) drawn today and your tests are completely normal, you will receive your results only by: [place] (if you have MyChart) OR A paper copy in the mail If you have any lab test that is abnormal or we need to change your treatment, we will call you to review the results.  Testing/Procedures: None.  Follow-Up: At [facility], you and your health needs are our priority.  As part of our continuing mission to provide you with exceptional heart care, we have created designated Provider Care Teams.  These Care Teams include your primary Cardiologist (physician) and Advanced Practice Providers (APPs -  Physician Assistants and Nurse Practitioners) who all work together to provide you with the care you need, when you need it.  Your physician wants you to follow-up in: As needed with Lars Mage, MD. Please call the office if you wish to move forward with SVT ablation.   We recommend signing up for the patient portal called "MyChart".  Sign up information is provided on this After Visit Summary.  MyChart is used to connect with patients for Virtual Visits (Telemedicine).  Patients are able to view lab/test results, encounter notes, upcoming appointments, etc.  Non-urgent messages can be sent to your provider as well.   To learn more about what you can do with MyChart, go to [email or web address].    Any Other Special Instructions Will Be Listed Below (If Applicable).  Cardiac Ablation Cardiac ablation is a procedure to destroy (ablate) some heart tissue that is sending bad signals. These bad signals cause problems in heart rhythm. The heart has many areas that make these  signals. If there are problems in these areas, they can make the heart beat in a way that is not normal. Destroying some tissues can help make the heart rhythm normal. Tell your doctor about: Any allergies you have. All medicines you are taking. These include vitamins, herbs, eye drops, creams, and over-the-counter medicines. Any problems you or family members have had with medicines that make you fall asleep (anesthetics). Any blood disorders you have. Any surgeries you have had. Any medical conditions you have, such as kidney failure. Whether you are pregnant or may be pregnant. What are the risks? This is a safe procedure. But problems may occur, including: Infection. Bruising and bleeding. Bleeding into the chest. Stroke or blood clots. Damage to nearby areas of your body. Allergies to medicines or dyes. The need for a pacemaker if the normal system is damaged. Failure of the procedure to treat the problem. What happens before the procedure? Medicines Ask your doctor about: Changing or stopping your normal medicines. This is important. Taking aspirin and ibuprofen. Do not take these medicines unless your doctor tells you to take them. Taking other medicines, vitamins, herbs, and supplements. General instructions Follow instructions from your doctor about what you cannot eat or drink. Plan to have someone take you home from the hospital or clinic. If you will be going home right after the procedure, plan to have someone with you for 24 hours. Ask your doctor what steps will be taken to prevent infection. What happens during the procedure?  An IV tube will be put into one of your veins. You will be  given a medicine to help you relax. The skin on your neck or groin will be numbed. A cut (incision) will be made in your neck or groin. A needle will be put through your cut and into a large vein. A tube (catheter) will be put into the needle. The tube will be moved to your  heart. Dye may be put through the tube. This helps your doctor see your heart. Small devices (electrodes) on the tube will send out signals. A type of energy will be used to destroy some heart tissue. The tube will be taken out. Pressure will be held on your cut. This helps stop bleeding. A bandage will be put over your cut. The exact procedure may vary among doctors and hospitals. What happens after the procedure? You will be watched until you leave the hospital or clinic. This includes checking your heart rate, breathing rate, oxygen, and blood pressure. Your cut will be watched for bleeding. You will need to lie still for a few hours. Do not drive for 24 hours or as long as your doctor tells you. Summary Cardiac ablation is a procedure to destroy some heart tissue. This is done to treat heart rhythm problems. Tell your doctor about any medical conditions you may have. Tell him or her about all medicines you are taking to treat them. This is a safe procedure. But problems may occur. These include infection, bruising, bleeding, and damage to nearby areas of your body. Follow what your doctor tells you about food and drink. You may also be told to change or stop some of your medicines. After the procedure, do not drive for 24 hours or as long as your doctor tells you. This information is not intended to replace advice given to you by your health care provider. Make sure you discuss any questions you have with your health care provider. Document Revised: 10/03/2019 Document Reviewed: 10/03/2019 Elsevier Patient Education  [place].

## 2022-09-06 ENCOUNTER — Other Ambulatory Visit: Payer: Self-pay | Admitting: Obstetrics and Gynecology

## 2022-09-06 DIAGNOSIS — Z1231 Encounter for screening mammogram for malignant neoplasm of breast: Secondary | ICD-10-CM

## 2022-09-21 ENCOUNTER — Encounter: Payer: Self-pay | Admitting: Dermatology

## 2022-10-13 ENCOUNTER — Ambulatory Visit
Admission: RE | Admit: 2022-10-13 | Discharge: 2022-10-13 | Disposition: A | Discharge status: Home / Self Care | Payer: Self-pay | Source: Ambulatory Visit | Attending: Obstetrics and Gynecology | Admitting: Obstetrics and Gynecology

## 2022-10-13 DIAGNOSIS — Z1231 Encounter for screening mammogram for malignant neoplasm of breast: Secondary | ICD-10-CM | POA: Insufficient documentation

## 2022-12-05 ENCOUNTER — Ambulatory Visit (INDEPENDENT_AMBULATORY_CARE_PROVIDER_SITE_OTHER): Payer: Self-pay | Admitting: Dermatology

## 2022-12-05 DIAGNOSIS — L821 Other seborrheic keratosis: Secondary | ICD-10-CM

## 2022-12-05 DIAGNOSIS — L918 Other hypertrophic disorders of the skin: Secondary | ICD-10-CM

## 2022-12-05 DIAGNOSIS — Z1283 Encounter for screening for malignant neoplasm of skin: Secondary | ICD-10-CM

## 2022-12-05 DIAGNOSIS — L814 Other melanin hyperpigmentation: Secondary | ICD-10-CM

## 2022-12-05 DIAGNOSIS — I8393 Asymptomatic varicose veins of bilateral lower extremities: Secondary | ICD-10-CM

## 2022-12-05 DIAGNOSIS — L578 Other skin changes due to chronic exposure to nonionizing radiation: Secondary | ICD-10-CM

## 2022-12-05 DIAGNOSIS — D229 Melanocytic nevi, unspecified: Secondary | ICD-10-CM

## 2022-12-05 DIAGNOSIS — D1801 Hemangioma of skin and subcutaneous tissue: Secondary | ICD-10-CM

## 2022-12-05 NOTE — Progress Notes (Signed)
   Follow-Up Visit   Subjective  Deborah Macias is a 62 y.o. female who presents for the following: Annual Exam (No history of skin cancer or abnormal moles - The patient presents for Total-Body Skin Exam (TBSE) for skin cancer screening and mole check.  The patient has spots, moles and lesions to be evaluated, some may be new or changing and the patient has concerns that these could be cancer./).  The following portions of the chart were reviewed this encounter and updated as appropriate:   Tobacco  Allergies  Meds  Problems  Med Hx  Surg Hx  Fam Hx     Review of Systems:  No other skin or systemic complaints except as noted in HPI or Assessment and Plan.  Objective  Well appearing patient in no apparent distress; mood and affect are within normal limits.  A full examination was performed including scalp, head, eyes, ears, nose, lips, neck, chest, axillae, abdomen, back, buttocks, bilateral upper extremities, bilateral lower extremities, hands, feet, fingers, toes, fingernails, and toenails. All findings within normal limits unless otherwise noted below.  Legs Spider veins   Assessment & Plan   Acrochordons (Skin Tags) - Fleshy, skin-colored pedunculated papules - Benign appearing.  - Observe. - If desired, they can be removed with an in office procedure that is not covered by insurance. - Please call the clinic if you notice any new or changing lesions.  Lentigines - Scattered tan macules - Due to sun exposure - Benign-appearing, observe - Recommend daily broad spectrum sunscreen SPF 30+ to sun-exposed areas, reapply every 2 hours as needed. - Call for any changes  Seborrheic Keratoses - Stuck-on, waxy, tan-brown papules and/or plaques  - Benign-appearing - Discussed benign etiology and prognosis. - Observe - Call for any changes  Melanocytic Nevi - Tan-brown and/or pink-flesh-colored symmetric macules and papules - Benign appearing on exam today -  Observation - Call clinic for new or changing moles - Recommend daily use of broad spectrum spf 30+ sunscreen to sun-exposed areas.   Hemangiomas - Red papules - Discussed benign nature - Observe - Call for any changes  Actinic Damage - Chronic condition, secondary to cumulative UV/sun exposure - diffuse scaly erythematous macules with underlying dyspigmentation - Recommend daily broad spectrum sunscreen SPF 30+ to sun-exposed areas, reapply every 2 hours as needed.  - Staying in the shade or wearing long sleeves, sun glasses (UVA+UVB protection) and wide brim hats (4-inch brim around the entire circumference of the hat) are also recommended for sun protection.  - Call for new or changing lesions.  Skin cancer screening performed today.  Spider veins of both lower extremities Legs  Benign-appearing.  Observation.  Call clinic for new or changing lesions.  Recommend daily use of broad spectrum spf 30+ sunscreen to sun-exposed areas.     Return today (on 12/05/2022) for 2-3 years TBSE.  I, Ashok Cordia, CMA, am acting as scribe for Sarina Ser, MD . Documentation: I have reviewed the above documentation for accuracy and completeness, and I agree with the above.  Sarina Ser, MD

## 2022-12-05 NOTE — Patient Instructions (Signed)
Due to recent changes in healthcare laws, you may see results of your pathology and/or laboratory studies on MyChart before the doctors have had a chance to review them. We understand that in some cases there may be results that are confusing or concerning to you. Please understand that not all results are received at the same time and often the doctors may need to interpret multiple results in order to provide you with the best plan of care or course of treatment. Therefore, we ask that you please give us 2 business days to thoroughly review all your results before contacting the office for clarification. Should we see a critical lab result, you will be contacted sooner.   If You Need Anything After Your Visit  If you have any questions or concerns for your doctor, please call our main line at [phone] and press option 4 to reach your doctor's medical assistant. If no one answers, please leave a voicemail as directed and we will return your call as soon as possible. Messages left after 4 pm will be answered the following business day.   You may also send us a message via MyChart. We typically respond to MyChart messages within 1-2 business days.  For prescription refills, please ask your pharmacy to contact our office. Our fax number is [phone].  If you have an urgent issue when the clinic is closed that cannot wait until the next business day, you can page your doctor at the number below.    Please note that while we do our best to be available for urgent issues outside of office hours, we are not available 24/7.   If you have an urgent issue and are unable to reach us, you may choose to seek medical care at your doctor's office, retail clinic, urgent care center, or emergency room.  If you have a medical emergency, please immediately call 911 or go to the emergency department.  Pager Numbers  - Dr. Kowalski: [phone]  - Dr. Moye: [phone]  - Dr. Stewart:  [phone]  In the event of inclement weather, please call our main line at [phone] for an update on the status of any delays or closures.  Dermatology Medication Tips: Please keep the boxes that topical medications come in in order to help keep track of the instructions about where and how to use these. Pharmacies typically print the medication instructions only on the boxes and not directly on the medication tubes.   If your medication is too expensive, please contact our office at [phone] option 4 or send us a message through MyChart.   We are unable to tell what your co-pay for medications will be in advance as this is different depending on your insurance coverage. However, we may be able to find a substitute medication at lower cost or fill out paperwork to get insurance to cover a needed medication.   If a prior authorization is required to get your medication covered by your insurance company, please allow us 1-2 business days to complete this process.  Drug prices often vary depending on where the prescription is filled and some pharmacies may offer cheaper prices.  The website www.goodrx.com contains coupons for medications through different pharmacies. The prices here do not account for what the cost may be with help from insurance (it may be cheaper with your insurance), but the website can give you the price if you did not use any insurance.  - You can print the associated coupon and take it with   your prescription to the pharmacy.  - You may also stop by our office during regular business hours and pick up a GoodRx coupon card.  - If you need your prescription sent electronically to a different pharmacy, notify our office through [place] MyChart or by phone at [phone] option 4.     Si Usted Necesita Algo Despus de Su Visita  Tambin puede enviarnos un mensaje a travs de MyChart. Por lo general respondemos a los mensajes de MyChart en el transcurso de 1 a 2  das hbiles.  Para renovar recetas, por favor pida a su farmacia que se ponga en contacto con nuestra oficina. Nuestro nmero de fax es el [phone].  Si tiene un asunto urgente cuando la clnica est cerrada y que no puede esperar hasta el siguiente da hbil, puede llamar/localizar a su doctor(a) al nmero que aparece a continuacin.   Por favor, tenga en cuenta que aunque hacemos todo lo posible para estar disponibles para asuntos urgentes fuera del horario de oficina, no estamos disponibles las 24 horas del da, los 7 das de la semana.   Si tiene un problema urgente y no puede comunicarse con nosotros, puede optar por buscar atencin mdica  en el consultorio de su doctor(a), en una clnica privada, en un centro de atencin urgente o en una sala de emergencias.  Si tiene una emergencia mdica, por favor llame inmediatamente al 911 o vaya a la sala de emergencias.  Nmeros de bper  - Dr. Kowalski: [phone]  - Dra. Moye: [phone]  - Dra. Stewart: [phone]  En caso de inclemencias del tiempo, por favor llame a nuestra lnea principal al [phone] para una actualizacin sobre el estado de cualquier retraso o cierre.  Consejos para la medicacin en dermatologa: Por favor, guarde las cajas en las que vienen los medicamentos de uso tpico para ayudarle a seguir las instrucciones sobre dnde y cmo usarlos. Las farmacias generalmente imprimen las instrucciones del medicamento slo en las cajas y no directamente en los tubos del medicamento.   Si su medicamento es muy caro, por favor, pngase en contacto con nuestra oficina llamando al [phone] y presione la opcin 4 o envenos un mensaje a travs de MyChart.   No podemos decirle cul ser su copago por los medicamentos por adelantado ya que esto es diferente dependiendo de la cobertura de su seguro. Sin embargo, es posible que podamos encontrar un medicamento sustituto a menor costo o llenar un formulario para que el  seguro cubra el medicamento que se considera necesario.   Si se requiere una autorizacin previa para que su compaa de seguros cubra su medicamento, por favor permtanos de 1 a 2 das hbiles para completar este proceso.  Los precios de los medicamentos varan con frecuencia dependiendo del lugar de dnde se surte la receta y alguna farmacias pueden ofrecer precios ms baratos.  El sitio web www.goodrx.com tiene cupones para medicamentos de diferentes farmacias. Los precios aqu no tienen en cuenta lo que podra costar con la ayuda del seguro (puede ser ms barato con su seguro), pero el sitio web puede darle el precio si no utiliz ningn seguro.  - Puede imprimir el cupn correspondiente y llevarlo con su receta a la farmacia.  - Tambin puede pasar por nuestra oficina durante el horario de atencin regular y recoger una tarjeta de cupones de GoodRx.  - Si necesita que su receta se enve electrnicamente a una farmacia diferente, informe a nuestra oficina a travs de MyChart de    o por telfono llamando al [phone] y presione la opcin 4.  

## 2022-12-14 ENCOUNTER — Encounter: Payer: Self-pay | Admitting: Dermatology

## 2023-07-27 NOTE — Progress Notes (Signed)
Cardiology Office Note Date:  07/28/2023  Patient ID:  Deborah Macias, DOB 1961/02/26, MRN [id number] PCP:  Lynnea Ferrier, MD  Cardiologist:  None Electrophysiologist: Lanier Prude, MD    Chief Complaint: SVT  History of Present Illness: Deborah Macias is a 62 y.o. female with PMH notable for SVT, hypothyroid; seen today for Lanier Prude, MD for acute visit due to increased SVT burden.   The patient was initially seen by Dr. Lalla Brothers for SVT eval 04/2022. She went to ER 03/2022 with HR > 200 that was successfully broke with adenosine. She was offered EP study w ablation or medications, but wanted to think more about it.   On follow-up today, she has been having palpitation episodes more often than she was last year. During episodes, she feels a head rush, and then palpitations and dizziness. No chest pain, pressure or SOB with episodes. No syncope. She is able to break the episodes with laying down with feet elevated and vagal maneuvers, but the episodes are interfering more with her life. She had several episodes recently while on vacation.  She does not check BP regularly, has checked it a few times recently and recalls systolic numbers in the 150s.  She has noticed that if she drinks more tea, will have more palpitations, but reducing caffeine has not prevented episodes.    AAD History: none  History reviewed. No pertinent past medical history.  Past Surgical History:  Procedure Laterality Date   AUGMENTATION MAMMAPLASTY Bilateral    1994 left replaced 99 rt replaced 08    Current Outpatient Medications  Medication Instructions   ascorbic acid (VITAMIN C) 500 MG tablet Oral   azelastine (ASTELIN) 0.1 % nasal spray Nasal, As needed   cetirizine (ZYRTEC) 10 MG tablet Oral, As needed   Cholecalciferol 100 MCG (4000 UT) TABS 2 tablets, Oral, Daily   EPIFOAM 1-1 % foam Topical, As needed   estradiol (ESTRACE) 0.1 MG/GM vaginal cream 1 Applicatorful, Vaginal,  Daily   liothyronine (CYTOMEL) 5 mcg, Oral, 2 times daily   Magnesium Oxide 400 MG CAPS Oral, Daily   Zinc 100 MG TABS Oral    Social History:  The patient  reports that she has never smoked. She has never used smokeless tobacco. She reports that she does not drink alcohol and does not use drugs.   Family History:  The patient's family history includes Breast cancer (age of onset: 84) in her maternal grandmother; Heart disease in her father; Ovarian cancer in her mother.  ROS:  Please see the history of present illness. All other systems are reviewed and otherwise negative.   PHYSICAL EXAM:  VS:  BP (!) 150/90 (BP Location: Left Arm, Patient Position: Sitting, Cuff Size: Normal)   Pulse 81   Ht 5\' 2"  (1.575 m)   Wt 170 lb 4 oz (77.2 kg)   SpO2 97%   BMI 31.14 kg/m  BMI: Body mass index is 31.14 kg/m.  Vitals:   07/28/23 1000 07/28/23 1015  BP: (!) 150/90 (!) 156/94  Pulse: 81   Height: 5\' 2"  (1.575 m)   Weight: 170 lb 4 oz (77.2 kg)   SpO2: 97%   BMI (Calculated): 31.13     GEN- The patient is well appearing, alert and oriented x 3 today.   Lungs- Clear to ausculation bilaterally, normal work of breathing.  Heart- Regular rate and rhythm, no murmurs, rubs or gallops Extremities- No peripheral edema, warm, dry   EKG is  ordered. Personal review of EKG from today shows:    EKG Interpretation Date/Time:  Friday July 28 2023 10:08:30 EDT Ventricular Rate:  81 PR Interval:  136 QRS Duration:  74 QT Interval:  370 QTC Calculation: 429 R Axis:   27  Text Interpretation: Normal sinus rhythm Normal ECG Confirmed by Sherie Don [phone]) on 07/28/2023 10:09:31 AM    03/29/2022 EKG: SVT, rate 196  Recent Labs: No results found for requested labs within last 365 days.  No results found for requested labs within last 365 days.   CrCl cannot be calculated (Patient's most recent lab result is older than the maximum 21 days allowed.).   Wt Readings from Last 3 Encounters:   07/28/23 170 lb 4 oz (77.2 kg)  05/11/22 161 lb (73 kg)  03/29/22 180 lb (81.6 kg)     Additional studies reviewed include: Previous EP, cardiology notes.     ASSESSMENT AND PLAN:  #) SVT Narrow complex tachycardia Had previously been offered SVT ablation, she is now interested in pursueing Requests cost estimate of procedure prior to scheduling Echo to confirm normal LVEF Will start dilt 120mg  daily  #) Elevated BP Elevated in office today on recheck, and appears to be elevated at home too Start dilt as above Recommended she obtain BP cuff and obtain readings 2-3 times per week and record those measurements Goal BP <140/90      Current medicines are reviewed at length with the patient today.   The patient does not have concerns regarding her medicines.  The following changes were made today:   START diltiazem 120mg  daily, take at night  Labs/ tests ordered today include:  Orders Placed This Encounter  Procedures   EKG 12-Lead     Disposition: Follow up with Dr. Lalla Brothers in 3 months for pre-procedure appt, sooner if needed based on procedure schedule  Signed, Sherie Don, NP  07/28/23  10:09 AM  Electrophysiology [facility]

## 2023-07-28 ENCOUNTER — Ambulatory Visit: Payer: Self-pay | Attending: Cardiology | Admitting: Cardiology

## 2023-07-28 ENCOUNTER — Encounter: Payer: Self-pay | Admitting: Cardiology

## 2023-07-28 VITALS — BP 156/94 | HR 81 | Ht 62.0 in | Wt 170.2 lb

## 2023-07-28 DIAGNOSIS — R030 Elevated blood-pressure reading, without diagnosis of hypertension: Secondary | ICD-10-CM

## 2023-07-28 DIAGNOSIS — I471 Supraventricular tachycardia, unspecified: Secondary | ICD-10-CM

## 2023-07-28 MED ORDER — DILTIAZEM HCL ER 120 MG PO TB24: 120.0000 mg | ORAL_TABLET | Freq: Every day | ORAL | 3 refills | Status: DC

## 2023-07-28 NOTE — Patient Instructions (Addendum)
Medication Instructions:  START Diltiazem 120 mg daily   *If you need a refill on your cardiac medications before your next appointment, please call your pharmacy*  Testing:  Your physician has requested that you have an echocardiogram. Echocardiography is a painless test that uses sound waves to create images of your heart. It provides your doctor with information about the size and shape of your heart and how well your heart's chambers and valves are working.   You may receive an ultrasound enhancing agent through an IV if needed to better visualize your heart during the echo. This procedure takes approximately one hour.  There are no restrictions for this procedure.  This will take place at [address] [address] [facility], [place] [address]    Follow-Up: At [facility], you and your health needs are our priority.  As part of our continuing mission to provide you with exceptional heart care, we have created designated Provider Care Teams.  These Care Teams include your primary Cardiologist (physician) and Advanced Practice Providers (APPs -  Physician Assistants and Nurse Practitioners) who all work together to provide you with the care you need, when you need it.  We recommend signing up for the patient portal called "MyChart".  Sign up information is provided on this After Visit Summary.  MyChart is used to connect with patients for Virtual Visits (Telemedicine).  Patients are able to view lab/test results, encounter notes, upcoming appointments, etc.  Non-urgent messages can be sent to your provider as well.   To learn more about what you can do with MyChart, go to [email or web address].    Your next appointment:   3 month(s)  Provider:   Steffanie Dunn, MD    Other Instructions Take your blood pressure at home.  We will reach out about information regarding the ablation.

## 2023-07-31 ENCOUNTER — Other Ambulatory Visit: Payer: Self-pay | Admitting: Cardiology

## 2023-07-31 DIAGNOSIS — R030 Elevated blood-pressure reading, without diagnosis of hypertension: Secondary | ICD-10-CM

## 2023-07-31 DIAGNOSIS — I471 Supraventricular tachycardia, unspecified: Secondary | ICD-10-CM

## 2023-08-03 ENCOUNTER — Ambulatory Visit: Payer: Self-pay | Attending: Cardiology

## 2023-08-03 DIAGNOSIS — I471 Supraventricular tachycardia, unspecified: Secondary | ICD-10-CM

## 2023-08-03 LAB — ECHOCARDIOGRAM COMPLETE
Area-P 1/2: 3.77 cm2
S' Lateral: 2.5 cm

## 2023-11-21 ENCOUNTER — Other Ambulatory Visit: Payer: Self-pay | Admitting: Obstetrics and Gynecology

## 2023-11-21 DIAGNOSIS — Z1231 Encounter for screening mammogram for malignant neoplasm of breast: Secondary | ICD-10-CM

## 2023-11-21 NOTE — Progress Notes (Signed)
 Electrophysiology Office Follow up Visit Note:    Date:  11/22/2023   ID:  Deborah Macias, DOB [birth year], MRN [id number]  PCP:  Fernande Ophelia JINNY DOUGLAS, MD  [facility] Cardiologist:  None  [facility] Electrophysiologist:  OLE ONEIDA HOLTS, MD    Interval History:     Deborah Macias is a 63 y.o. female who presents for a follow up visit.   I last saw her May 11, 2022 for SVT.  She has a history of adenosine  sensitive SVT.  At that appointment I discussed medications and EP study with possible ablation.  At that appointment she wanted more time to consider her options which was very reasonable.  She saw Charlies in clinic July 28, 2023.  She reported more episodes of SVT over the last year.  Episodes were terminated at home using the Valsalva maneuver.  At the appointment with Elvie she expressed an interest in pursuing EP study with possible catheter ablation of her SVT.  She is with her husband in clinic today.  She is tolerating the diltiazem  well.  No lightheadedness or dizziness.  She has noticed her blood pressure has trended down into the 130s.  She has felt brief salvos of arrhythmia but none sustained since starting the diltiazem .  Before starting the diltiazem  episodes were occurring multiple times per month and were trending towards becoming more frequent.      Past medical, surgical, social and family history were reviewed.  ROS:   Please see the history of present illness.    All other systems reviewed and are negative.  EKGs/Labs/Other Studies Reviewed:    The following studies were reviewed today:  August 03, 2023 echo EF 60% RV function normal Mild MR  July 28, 2023 EKG shows sinus rhythm.  No preexcitation.       Physical Exam:    VS:  BP 132/86   Pulse 83   Ht 5' 1 (1.549 m)   Wt 172 lb (78 kg)   SpO2 97%   BMI 32.50 kg/m     Wt Readings from Last 3 Encounters:  11/22/23 172 lb (78 kg)  07/28/23 170 lb 4 oz (77.2 kg)   05/11/22 161 lb (73 kg)     GEN: no distress CARD: RRR, No MRG RESP: No IWOB. CTAB.      ASSESSMENT:    1. SVT (supraventricular tachycardia) (HCC)    PLAN:    In order of problems listed above:  #SVT History of recurrent, symptomatic, adenosine  sensitive SVT.  I have discussed pathophysiology of SVT in detail.  We discussed treatment options including medical therapy and EP study with possible ablation.  She is very clear in her decision to pursue EP study and ablation.  Risk, benefits, and alternatives to EP study and radiofrequency ablation for SVT were also discussed in detail today. These risks include but are not limited to complete heart block, stroke, bleeding, vascular damage, tamponade, perforation, and death. The patient understands these risks.  She will think about her options and let us  know how she would like to proceed.  If she elects to proceed, anesthesia, Carto and ICE are requested for the procedure. She will need to hold her diltiazem  for 5 days prior to the procedure.  For now, continue diltiazem .  #Elevated blood pressure Has improved on diltiazem .  Continue monitoring blood pressures at home.  Follow-up with primary care physician.  Follow-up with EP APP in 1 year.    Signed, Ole Holts, MD,  FACC, [facility] 11/22/2023 9:15 AM    Electrophysiology [facility]

## 2023-11-22 ENCOUNTER — Ambulatory Visit: Payer: Self-pay | Admitting: Cardiology

## 2023-11-22 ENCOUNTER — Ambulatory Visit: Payer: Self-pay | Attending: Cardiology | Admitting: Cardiology

## 2023-11-22 VITALS — BP 132/86 | HR 83 | Ht 61.0 in | Wt 172.0 lb

## 2023-11-22 DIAGNOSIS — R030 Elevated blood-pressure reading, without diagnosis of hypertension: Secondary | ICD-10-CM

## 2023-11-22 DIAGNOSIS — I471 Supraventricular tachycardia, unspecified: Secondary | ICD-10-CM

## 2023-11-22 NOTE — Patient Instructions (Addendum)
 Medication Instructions:  Your physician recommends that you continue on your current medications as directed. Please refer to the Current Medication list given to you today.  *If you need a refill on your cardiac medications before your next appointment, please call your pharmacy*  Follow-Up: At [facility], you and your health needs are our priority.  As part of our continuing mission to provide you with exceptional heart care, we have created designated Provider Care Teams.  These Care Teams include your primary Cardiologist (physician) and Advanced Practice Providers (APPs -  Physician Assistants and Nurse Practitioners) who all work together to provide you with the care you need, when you need it.  Your next appointment:   Please call back if you decide that you would like to schedule an ablation.   For billing information you can contact the pre-service center at [phone]. The CPT code for an SVT ablation is [phone]

## 2023-11-30 ENCOUNTER — Ambulatory Visit
Admission: RE | Admit: 2023-11-30 | Discharge: 2023-11-30 | Disposition: A | Discharge status: Home / Self Care | Payer: Self-pay | Source: Ambulatory Visit | Attending: Obstetrics and Gynecology | Admitting: Obstetrics and Gynecology

## 2023-11-30 ENCOUNTER — Other Ambulatory Visit: Payer: Self-pay | Admitting: Obstetrics and Gynecology

## 2023-11-30 DIAGNOSIS — Z1231 Encounter for screening mammogram for malignant neoplasm of breast: Secondary | ICD-10-CM | POA: Insufficient documentation

## 2023-12-08 ENCOUNTER — Telehealth: Payer: Self-pay | Admitting: Cardiology

## 2023-12-08 DIAGNOSIS — I471 Supraventricular tachycardia, unspecified: Secondary | ICD-10-CM

## 2023-12-08 NOTE — Telephone Encounter (Signed)
Patient calling to say that she is ready to schedule her ablation. Please advise

## 2023-12-08 NOTE — Telephone Encounter (Signed)
Spoke with the patient and scheduled her for an ablation with Dr. Lalla Brothers on 2/24. Labs have been ordered.

## 2023-12-11 ENCOUNTER — Telehealth: Payer: Self-pay | Admitting: Cardiology

## 2023-12-11 NOTE — Telephone Encounter (Signed)
Spoke with the patient and reviewed some restrictions for after her procedure - no driving for 3 days, no lifting over 10 lbs for 5 days, and no strenuous activity for 7 days. Patient verbalized understanding.

## 2023-12-11 NOTE — Telephone Encounter (Signed)
Patient calling to see if there will be ins restriction after her procedure. Please advise

## 2023-12-20 ENCOUNTER — Telehealth (HOSPITAL_COMMUNITY): Payer: Self-pay | Admitting: Licensed Clinical Social Worker

## 2023-12-20 NOTE — Telephone Encounter (Signed)
 H&V Care Navigation CSW Progress Note  Clinical Social Worker consulted to speak with pt regarding upcoming procedure while being a self pay.  Patient reports she has a [employer] so once she is billed by [facility] she will be able to ask them for reimbursement.  Her main concern was getting estimates for procedures so she could be prepared- she made lots of calls yesterday and was able to get the procedure estimate but not the MD portion- CSW able to confirm that message was also sent to billing staff to follow up regarding MD portion of procedure.  CSW will follow up with pt in a few days to ensure she has received the information she needed.   SDOH Screenings   Food Insecurity: No Food Insecurity (12/08/2023)   Received from [facility]  Housing: Low Risk  (12/08/2023)   Received from [facility]  Transportation Needs: No Transportation Needs (12/08/2023)   Received from [facility]  Utilities: Not At Risk (12/08/2023)   Received from [facility]  Financial Resource Strain: Low Risk  (12/08/2023)   Received from [facility]  Tobacco Use: Low Risk  (12/08/2023)   Received from [facility]   Andriette HILARIO Leech, LCSW Clinical Social Worker [facility] Desk#: [phone] Cell#: [phone]

## 2023-12-21 LAB — BASIC METABOLIC PANEL WITH GFR
BUN/Creatinine Ratio: 26 (ref 12–28)
BUN: 18 mg/dL (ref 8–27)
CO2: 24 mmol/L (ref 20–29)
Calcium: 9.7 mg/dL (ref 8.7–10.3)
Chloride: 101 mmol/L (ref 96–106)
Creatinine, Ser: 0.7 mg/dL (ref 0.57–1.00)
Glucose: 79 mg/dL (ref 70–99)
Potassium: 4.3 mmol/L (ref 3.5–5.2)
Sodium: 140 mmol/L (ref 134–144)
eGFR: 99 mL/min/1.73

## 2023-12-21 LAB — CBC
Hematocrit: 41.2 % (ref 34.0–46.6)
Hemoglobin: 14.2 g/dL (ref 11.1–15.9)
MCH: 32.4 pg (ref 26.6–33.0)
MCHC: 34.5 g/dL (ref 31.5–35.7)
MCV: 94 fL (ref 79–97)
Platelets: 299 x10E3/uL (ref 150–450)
RBC: 4.38 x10E6/uL (ref 3.77–5.28)
RDW: 12.1 % (ref 11.7–15.4)
WBC: 6.9 x10E3/uL (ref 3.4–10.8)

## 2024-01-05 NOTE — Pre-Procedure Instructions (Signed)
Instructed patient on the following items: Arrival time 0800 Nothing to eat or drink after midnight No meds AM of procedure Responsible person to drive you home and stay with you for 24 hrs

## 2024-01-07 NOTE — Anesthesia Preprocedure Evaluation (Signed)
 Anesthesia Evaluation  Patient identified by MRN, date of birth, ID band Patient awake    Reviewed: Allergy & Precautions, NPO status , Patient's Chart, lab work & pertinent test results  Airway Mallampati: II  TM Distance: >3 FB Neck ROM: Full    Dental  (+) Dental Advisory Given, Teeth Intact   Pulmonary neg pulmonary ROS   Pulmonary exam normal breath sounds clear to auscultation       Cardiovascular Normal cardiovascular exam+ Valvular Problems/Murmurs MR  Rhythm:Regular Rate:Normal  Echo 07/2023  1. Left ventricular ejection fraction, by estimation, is 60 to 65%. The left ventricle has normal function. The left ventricle has no regional wall motion abnormalities. Left ventricular diastolic parameters are consistent with Grade I diastolic dysfunction (impaired relaxation). The average left ventricular global longitudinal strain is -21.0%.   2. Right ventricular systolic function is normal. The right ventricular size is normal. Tricuspid regurgitation signal is inadequate for assessing PA pressure.   3. The mitral valve is normal in structure. Mild mitral valve regurgitation. No evidence of mitral stenosis.   4. The aortic valve has an indeterminant number of cusps. Aortic valve regurgitation is not visualized. No aortic stenosis is present.   5. The inferior vena cava is normal in size with greater than 50% respiratory variability, suggesting right atrial pressure of 3 mmHg.      Neuro/Psych negative neurological ROS     GI/Hepatic negative GI ROS, Neg liver ROS,,,  Endo/Other  negative endocrine ROS    Renal/GU negative Renal ROS     Musculoskeletal negative musculoskeletal ROS (+)    Abdominal  (+) + obese  Peds  Hematology negative hematology ROS (+)   Anesthesia Other Findings   Reproductive/Obstetrics                              Anesthesia Physical Anesthesia Plan  ASA:  2  Anesthesia Plan: General and MAC   Post-op Pain Management: Tylenol PO (pre-op)* and Minimal or no pain anticipated   Induction: Intravenous  PONV Risk Score and Plan: Ondansetron, Dexamethasone and Treatment may vary due to age or medical condition  Airway Management Planned:   Additional Equipment:   Intra-op Plan:   Post-operative Plan:   Informed Consent: I have reviewed the patients History and Physical, chart, labs and discussed the procedure including the risks, benefits and alternatives for the proposed anesthesia with the patient or authorized representative who has indicated his/her understanding and acceptance.     Dental advisory given  Plan Discussed with: CRNA  Anesthesia Plan Comments:         Anesthesia Quick Evaluation

## 2024-01-08 ENCOUNTER — Ambulatory Visit (HOSPITAL_COMMUNITY): Payer: Self-pay | Admitting: Anesthesiology

## 2024-01-08 ENCOUNTER — Encounter (HOSPITAL_COMMUNITY): Payer: Self-pay | Admitting: Cardiology

## 2024-01-08 ENCOUNTER — Ambulatory Visit (HOSPITAL_COMMUNITY)
Admission: RE | Admit: 2024-01-08 | Discharge: 2024-01-08 | Disposition: A | Discharge status: Home / Self Care | Payer: Self-pay | Source: Ambulatory Visit | Attending: Cardiology | Admitting: Cardiology

## 2024-01-08 ENCOUNTER — Other Ambulatory Visit: Payer: Self-pay

## 2024-01-08 ENCOUNTER — Encounter (HOSPITAL_COMMUNITY)
Admission: RE | Disposition: A | Discharge status: Home / Self Care | Payer: Self-pay | Source: Ambulatory Visit | Attending: Cardiology

## 2024-01-08 DIAGNOSIS — I471 Supraventricular tachycardia, unspecified: Secondary | ICD-10-CM

## 2024-01-08 DIAGNOSIS — Z79899 Other long term (current) drug therapy: Secondary | ICD-10-CM | POA: Insufficient documentation

## 2024-01-08 DIAGNOSIS — I4719 Other supraventricular tachycardia: Secondary | ICD-10-CM | POA: Insufficient documentation

## 2024-01-08 DIAGNOSIS — R030 Elevated blood-pressure reading, without diagnosis of hypertension: Secondary | ICD-10-CM | POA: Insufficient documentation

## 2024-01-08 HISTORY — PX: SVT ABLATION: EP1225

## 2024-01-08 SURGERY — SVT ABLATION
Anesthesia: General

## 2024-01-08 MED ORDER — PROPOFOL 10 MG/ML IV BOLUS
INTRAVENOUS | Status: DC | PRN
Start: 2024-01-08 — End: 2024-01-08
  Administered 2024-01-08: 20 mg via INTRAVENOUS

## 2024-01-08 MED ORDER — BUPIVACAINE HCL (PF) 0.25 % IJ SOLN
INTRAMUSCULAR | Status: DC | PRN
  Administered 2024-01-08: 10 mL

## 2024-01-08 MED ORDER — BUPIVACAINE HCL (PF) 0.25 % IJ SOLN
INTRAMUSCULAR | Status: AC
Start: 2024-01-08 — End: ?
  Filled 2024-01-08: qty 30

## 2024-01-08 MED ORDER — ACETAMINOPHEN 325 MG PO TABS
ORAL_TABLET | ORAL | Status: AC
  Filled 2024-01-08: qty 2

## 2024-01-08 MED ORDER — SODIUM CHLORIDE 0.9% FLUSH: 3.0000 mL | INTRAVENOUS | Status: DC | PRN

## 2024-01-08 MED ORDER — ONDANSETRON HCL 4 MG/2ML IJ SOLN: 4.0000 mg | Freq: Four times a day (QID) | INTRAMUSCULAR | Status: DC | PRN

## 2024-01-08 MED ORDER — HEPARIN SODIUM (PORCINE) 1000 UNIT/ML IJ SOLN
INTRAMUSCULAR | Status: DC | PRN
  Administered 2024-01-08: 1000 [IU] via INTRAVENOUS

## 2024-01-08 MED ORDER — LIDOCAINE HCL (PF) 1 % IJ SOLN
INTRAMUSCULAR | Status: AC
Start: 2024-01-08 — End: ?
  Filled 2024-01-08: qty 30

## 2024-01-08 MED ORDER — ACETAMINOPHEN 325 MG PO TABS
650.0000 mg | ORAL_TABLET | ORAL | Status: DC | PRN
  Administered 2024-01-08: 650 mg via ORAL

## 2024-01-08 MED ORDER — SODIUM CHLORIDE 0.9 % IV SOLN: INTRAVENOUS | Status: DC

## 2024-01-08 MED ORDER — SODIUM CHLORIDE 0.9 % IV SOLN: 250.0000 mL | INTRAVENOUS | Status: DC | PRN

## 2024-01-08 MED ORDER — ONDANSETRON HCL 4 MG/2ML IJ SOLN
INTRAMUSCULAR | Status: DC | PRN
  Administered 2024-01-08: 4 mg via INTRAVENOUS

## 2024-01-08 MED ORDER — MIDAZOLAM HCL 2 MG/2ML IJ SOLN
INTRAMUSCULAR | Status: AC
Start: 2024-01-08 — End: 2024-01-08
  Filled 2024-01-08: qty 2

## 2024-01-08 MED ORDER — LIDOCAINE HCL (PF) 1 % IJ SOLN: INTRAMUSCULAR | Status: DC | PRN

## 2024-01-08 MED ORDER — ACETAMINOPHEN 500 MG PO TABS
1000.0000 mg | ORAL_TABLET | Freq: Once | ORAL | Status: AC
  Administered 2024-01-08: 1000 mg via ORAL
  Filled 2024-01-08: qty 2

## 2024-01-08 MED ORDER — LIDOCAINE 2% (20 MG/ML) 5 ML SYRINGE
INTRAMUSCULAR | Status: DC | PRN
  Administered 2024-01-08: 60 mg via INTRAVENOUS

## 2024-01-08 MED ORDER — ISOPROTERENOL HCL 0.2 MG/ML IJ SOLN
INTRAMUSCULAR | Status: AC
  Filled 2024-01-08: qty 5

## 2024-01-08 MED ORDER — PHENYLEPHRINE 80 MCG/ML (10ML) SYRINGE FOR IV PUSH (FOR BLOOD PRESSURE SUPPORT)
PREFILLED_SYRINGE | INTRAVENOUS | Status: DC | PRN
  Administered 2024-01-08: 80 ug via INTRAVENOUS

## 2024-01-08 MED ORDER — FENTANYL CITRATE (PF) 100 MCG/2ML IJ SOLN
INTRAMUSCULAR | Status: DC | PRN
  Administered 2024-01-08: 50 ug via INTRAVENOUS

## 2024-01-08 MED ORDER — ISOPROTERENOL HCL 0.2 MG/ML IJ SOLN
INTRAVENOUS | Status: DC | PRN
  Administered 2024-01-08: 2 ug/min via INTRAVENOUS

## 2024-01-08 MED ORDER — SODIUM CHLORIDE 0.9% FLUSH: 3.0000 mL | Freq: Two times a day (BID) | INTRAVENOUS | Status: DC

## 2024-01-08 MED ORDER — FENTANYL CITRATE (PF) 100 MCG/2ML IJ SOLN
INTRAMUSCULAR | Status: AC
  Filled 2024-01-08: qty 2

## 2024-01-08 MED ORDER — PROPOFOL 500 MG/50ML IV EMUL
INTRAVENOUS | Status: DC | PRN
  Administered 2024-01-08: 70 ug/kg/min via INTRAVENOUS

## 2024-01-08 MED ORDER — MIDAZOLAM HCL 2 MG/2ML IJ SOLN
INTRAMUSCULAR | Status: DC | PRN
  Administered 2024-01-08 (×2): 1 mg via INTRAVENOUS

## 2024-01-08 MED ORDER — HEPARIN (PORCINE) IN NACL 1000-0.9 UT/500ML-% IV SOLN
INTRAVENOUS | Status: DC | PRN
  Administered 2024-01-08 (×2): 500 mL

## 2024-01-08 NOTE — Transfer of Care (Signed)
 Immediate Anesthesia Transfer of Care Note  Patient: Deborah Macias  Procedure(s) Performed: SVT ABLATION  Patient Location: PACU  Anesthesia Type:MAC  Level of Consciousness: awake, alert , and oriented  Airway & Oxygen Therapy: Patient Spontanous Breathing  Post-op Assessment: Report given to RN and Post -op Vital signs reviewed and stable  Post vital signs: Reviewed and stable  Last Vitals:  Vitals Value Taken Time  BP    Temp    Pulse    Resp    SpO2      Last Pain:  Vitals:   01/08/24 0819  TempSrc: Oral  PainSc:          Complications: No notable events documented.

## 2024-01-08 NOTE — Anesthesia Postprocedure Evaluation (Signed)
 Anesthesia Post Note  Patient: Deborah Macias  Procedure(s) Performed: SVT ABLATION     Patient location during evaluation: PACU Anesthesia Type: MAC Level of consciousness: awake and alert Pain management: pain level controlled Vital Signs Assessment: post-procedure vital signs reviewed and stable Respiratory status: spontaneous breathing Cardiovascular status: stable Anesthetic complications: no   No notable events documented.  Last Vitals:  Vitals:   01/08/24 1325 01/08/24 1330  BP: 138/85 (!) 144/85  Pulse: 62 (!) 57  Resp: 13 10  Temp:    SpO2: 95% 97%    Last Pain:  Vitals:   01/08/24 1225  TempSrc: Temporal  PainSc: 6                  Lewie Loron

## 2024-01-08 NOTE — H&P (Signed)
 Electrophysiology Office Follow up Visit Note:     Date:  01/08/2024    ID:  Deborah Macias, DOB 10-28-1961, MRN [id number]   PCP:  Lynnea Ferrier, MD       [facility] Cardiologist:  None  [facility] Electrophysiologist:  Lanier Prude, MD      Interval History:       Deborah Macias is a 63 y.o. female who presents for a follow up visit.    I last saw her May 11, 2022 for SVT.  She has a history of adenosine sensitive SVT.  At that appointment I discussed medications and EP study with possible ablation.  At that appointment she wanted more time to consider her options which was very reasonable.  She saw Luster Landsberg in clinic July 28, 2023.  She reported more episodes of SVT over the last year.  Episodes were terminated at home using the Valsalva maneuver.  At the appointment with Rosalita Chessman she expressed an interest in pursuing EP study with possible catheter ablation of her SVT.   She is with her husband in clinic today.  She is tolerating the diltiazem well.  No lightheadedness or dizziness.  She has noticed her blood pressure has trended down into the 130s.  She has felt brief salvos of arrhythmia but none sustained since starting the diltiazem.  Before starting the diltiazem episodes were occurring multiple times per month and were trending towards becoming more frequent. Objective Past medical, surgical, social and family history were reviewed.  Presents for EP study and ablation today. Procedure has been reviewed and she wishes to proceed.   ROS:   Please see the history of present illness.    All other systems reviewed and are negative.   EKGs/Labs/Other Studies Reviewed:     The following studies were reviewed today:   August 03, 2023 echo EF 60% RV function normal Mild MR   July 28, 2023 EKG shows sinus rhythm.  No preexcitation.         Physical Exam:     VS:  BP 169/75   Pulse 70   Ht 5\' 1"  (1.549 m)   Wt 172 lb (78 kg)   SpO2 97%    BMI 32.50 kg/m         Wt Readings from Last 3 Encounters:  11/22/23 172 lb (78 kg)  07/28/23 170 lb 4 oz (77.2 kg)  05/11/22 161 lb (73 kg)      GEN: no distress CARD: RRR, No MRG RESP: No IWOB. CTAB.     Assessment ASSESSMENT:     1. SVT (supraventricular tachycardia) (HCC)     PLAN:     In order of problems listed above:   #SVT History of recurrent, symptomatic, adenosine sensitive SVT.  I have discussed pathophysiology of SVT in detail.  We discussed treatment options including medical therapy and EP study with possible ablation.  She is very clear in her decision to pursue EP study and ablation.   Risk, benefits, and alternatives to EP study and radiofrequency ablation for SVT were also discussed in detail today. These risks include but are not limited to complete heart block, stroke, bleeding, vascular damage, tamponade, perforation, and death. The patient understands these risks.  She will think about her options and let us know how she would like to proceed.  If she elects to proceed, anesthesia, Carto and ICE are requested for the procedure. She will need to hold her diltiazem for 5 days  prior to the procedure.   For now, continue diltiazem.   #Elevated blood pressure Has improved on diltiazem.  Continue monitoring blood pressures at home.  Follow-up with primary care physician.   She presents for EP study and ablation. Procedure reviewed.   Signed, Steffanie Dunn, MD, [facility], [facility], [place] 01/08/2024 Electrophysiology [facility]

## 2024-01-08 NOTE — Discharge Instructions (Signed)

## 2024-01-09 ENCOUNTER — Telehealth (HOSPITAL_COMMUNITY): Payer: Self-pay

## 2024-01-09 NOTE — Telephone Encounter (Signed)
 Spoke with patient to complete post procedure follow up call.  Patient has removed large bandage at right groin puncture site and repots no complications.    Instructions reviewed with patient:  It is normal to have bruising, tenderness and a pea or marble sized lump/knot at the groin site which can take up to three months to resolve.  Get help right away if you notice sudden swelling at the puncture site.  Check your puncture site every day for signs of infection: fever, redness, swelling, pus drainage, warmth, foul odor or excessive pain. If this occurs, please call the office at [phone], to speak with the nurse. Get help right away if your puncture site is bleeding and the bleeding does not stop after applying firm pressure to the area.  Patient aware to stop taking Diltiazem.  You will follow up with the APP on 02/05/24.   Patient verbalized understanding to all instructions provided.

## 2024-02-05 ENCOUNTER — Ambulatory Visit: Payer: Self-pay | Attending: Cardiology | Admitting: Cardiology

## 2024-02-05 VITALS — BP 155/90 | HR 81 | Ht 62.0 in | Wt 168.6 lb

## 2024-02-05 DIAGNOSIS — I471 Supraventricular tachycardia, unspecified: Secondary | ICD-10-CM

## 2024-02-05 NOTE — Patient Instructions (Signed)
   Follow-Up: At [facility], you and your health needs are our priority.  As part of our continuing mission to provide you with exceptional heart care, we have created designated Provider Care Teams.  These Care Teams include your primary Cardiologist (physician) and Advanced Practice Providers (APPs -  Physician Assistants and Nurse Practitioners) who all work together to provide you with the care you need, when you need it.  We recommend signing up for the patient portal called "MyChart".  Sign up information is provided on this After Visit Summary.  MyChart is used to connect with patients for Virtual Visits (Telemedicine).  Patients are able to view lab/test results, encounter notes, upcoming appointments, etc.  Non-urgent messages can be sent to your provider as well.   To learn more about what you can do with MyChart, go to [email or web address].    Your next appointment:   As needed if palpitations recur  Provider:   Steffanie Dunn, MD or Sherie Don, NP    Other Instructions Continue to follow-up with Primary care Provider

## 2024-02-05 NOTE — Progress Notes (Signed)
 Electrophysiology Clinic Note    Date:  02/05/2024  Patient ID:  Deborah Macias November 30, 1960, MRN [id number] PCP:  Lynnea Ferrier, MD  Cardiologist:  None Electrophysiologist: Lanier Prude, MD   Discussed the use of AI scribe software for clinical note transcription with the patient, who gave verbal consent to proceed.   Patient Profile    Chief Complaint: SVT ablation follow-up  History of Present Illness: Deborah Macias is a 63 y.o. female with PMH notable for SVT, hypothyroid; seen today for Lanier Prude, MD for routine electrophysiology followup.  I last saw her 07/2023 where she was having more SVT episodes. Dilt was started. She saw Dr. Lalla Brothers in follow-up 11/2023 where episode burden had decreased, but were still present.   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 Dilt was stopped post-procedure.  On follow-up today, she has not had any further SVT episodes since the procedure. Her R groin was bruised for about a week after the procedure, and felt somewhat irritated when wearing jeans. It has since completely healed and she is without complaints.   She is no longer checking BP regularly at home now that she is off diltiazem.     Arrhythmia/Device History SVT (AVNRT) ablation 12/2023    ROS:  Please see the history of present illness. All other systems are reviewed and otherwise negative.    Physical Exam    VS:  BP (!) 155/90 (BP Location: Left Arm, Patient Position: Sitting, Cuff Size: Normal)   Pulse 81   Ht 5\' 2"  (1.575 m)   Wt 168 lb 9.6 oz (76.5 kg)   SpO2 95%   BMI 30.84 kg/m  BMI: Body mass index is 30.84 kg/m.  Wt Readings from Last 3 Encounters:  02/05/24 168 lb 9.6 oz (76.5 kg)  01/08/24 160 lb (72.6 kg)  11/22/23 172 lb (78 kg)     GEN- The patient is well appearing, alert and oriented x 3 today.   Lungs- Clear to ausculation bilaterally, normal work of breathing.  Heart- Regular rate and rhythm, no  murmurs, rubs or gallops Extremities- No peripheral edema, warm, dry    Studies Reviewed   Previous EP, cardiology notes.    EKG is ordered. Personal review of EKG from today shows:    EKG Interpretation Date/Time:  Monday February 05 2024 13:35:42 EDT Ventricular Rate:  81 PR Interval:  142 QRS Duration:  76 QT Interval:  366 QTC Calculation: 425 R Axis:   33  Text Interpretation: Normal sinus rhythm Confirmed by Sherie Don [phone]) on 02/05/2024 1:41:28 PM     TTE, 08/03/2023  1. Left ventricular ejection fraction, by estimation, is 60 to 65%. The left ventricle has normal function. The left ventricle has no regional wall motion abnormalities. Left ventricular diastolic parameters are consistent with Grade I diastolic dysfunction (impaired relaxation). The average left ventricular global longitudinal strain is -21.0 %.   2. Right ventricular systolic function is normal. The right ventricular size is normal. Tricuspid regurgitation signal is inadequate for assessing PA pressure.   3. The mitral valve is normal in structure. Mild mitral valve regurgitation. No evidence of mitral stenosis.   4. The aortic valve has an indeterminant number of cusps. Aortic valve regurgitation is not visualized. No aortic stenosis is present.   5. The inferior vena cava is normal in size with greater than 50% respiratory variability, suggesting right atrial pressure of 3 mmHg.    Assessment  and Plan     #) SVT S/p ablation of typical  AVNRT 12/2023  No further palpitation episodes off dilitiazem          Current medicines are reviewed at length with the patient today.   The patient does not have concerns regarding her medicines.  The following changes were made today:  none  Labs/ tests ordered today include:  Orders Placed This Encounter  Procedures   EKG 12-Lead     Disposition: Follow up with Dr. Lalla Brothers or EP APP  PRN    Signed, Sherie Don, NP  02/05/24  1:54 PM   Electrophysiology [facility]

## 2024-04-29 ENCOUNTER — Ambulatory Visit (INDEPENDENT_AMBULATORY_CARE_PROVIDER_SITE_OTHER): Payer: Self-pay | Admitting: Dermatology

## 2024-04-29 ENCOUNTER — Encounter: Payer: Self-pay | Admitting: Dermatology

## 2024-04-29 DIAGNOSIS — L929 Granulomatous disorder of the skin and subcutaneous tissue, unspecified: Secondary | ICD-10-CM

## 2024-04-29 DIAGNOSIS — Z87828 Personal history of other (healed) physical injury and trauma: Secondary | ICD-10-CM

## 2024-04-29 DIAGNOSIS — Z7189 Other specified counseling: Secondary | ICD-10-CM

## 2024-04-29 DIAGNOSIS — Z79899 Other long term (current) drug therapy: Secondary | ICD-10-CM

## 2024-04-29 MED ORDER — TRIAMCINOLONE ACETONIDE 10 MG/ML IJ SUSP
2.5000 mg | Freq: Once | INTRAMUSCULAR | Status: AC
  Administered 2024-04-29: 2.5 mg via INTRADERMAL

## 2024-04-29 MED ORDER — CLOBETASOL PROPIONATE 0.05 % EX CREA: TOPICAL_CREAM | CUTANEOUS | 1 refills | Status: AC

## 2024-04-29 NOTE — Progress Notes (Signed)
   Follow-Up Visit   Subjective  Deborah Macias is a 63 y.o. female who presents for the following: Tick bite. Dur: couple of months. Itches. Hard spot. Has used OTC HC cream and Tea Tree Oil.   The patient has spots, moles and lesions to be evaluated, some may be new or changing and the patient may have concern these could be cancer.  The following portions of the chart were reviewed this encounter and updated as appropriate: medications, allergies, medical history  Review of Systems:  No other skin or systemic complaints except as noted in HPI or Assessment and Plan.  Objective  Well appearing patient in no apparent distress; mood and affect are within normal limits.  A focused examination was performed of the following areas: Left upper arm  Relevant physical exam findings are noted in the Assessment and Plan.  Left Upper Arm - Anterior Erythematous firm papule   Assessment & Plan   GRANULOMA DUE TO TICK BITE Left Upper Arm - Anterior Vs Dermatofibroma   Discussed topical corticosteroid vs ILK injection vs excision.  Patient prefers ILK and topical Rx.   Start Clobetasol cream Apply twice daily to affected area on arm until symptoms resolved. Avoid applying to face, groin, and axilla. Use as directed. Long-term use can cause thinning of the skin. Intralesional injection - Left Upper Arm - Anterior Location: Left upper arm  Informed Consent: Discussed risks (infection, pain, bleeding, bruising, thinning of the skin, loss of skin pigment, lack of resolution, and recurrence of lesion) and benefits of the procedure, as well as the alternatives. Informed consent was obtained. Preparation: The area was prepared a standard fashion.  Anesthesia: none  Procedure Details: An intralesional injection was performed with Kenalog 2.5 mg/cc. 0.15 cc in total were injected.  NDC: 1610-9604-54 Lot: [id number] Exp: 03/13/2026  Total number of injections: 2  Plan: The patient was  instructed on post-op care. Recommend OTC analgesia as needed for pain.  Related Medications triamcinolone acetonide (KENALOG) 10 MG/ML injection 2.5 mg    Return if symptoms worsen or fail to improve.  I, Jill Parcell, CMA, am acting as scribe for Celine Collard, MD.   Documentation: I have reviewed the above documentation for accuracy and completeness, and I agree with the above.  Celine Collard, MD

## 2024-04-29 NOTE — Patient Instructions (Addendum)
 Intralesional steroid injection side effects were reviewed including thinning of the skin and discoloration, such as redness, lightening or darkening.   Start Clobetasol cream Apply twice daily to affected area on arm until symptoms resolved. Avoid applying to face, groin, and axilla. Use as directed. Long-term use can cause thinning of the skin.    Topical steroids (such as triamcinolone, fluocinolone, fluocinonide, mometasone, clobetasol, halobetasol, betamethasone, hydrocortisone) can cause thinning and lightening of the skin if they are used for too long in the same area. Your physician has selected the right strength medicine for your problem and area affected on the body. Please use your medication only as directed by your physician to prevent side effects.      Recommend daily broad spectrum sunscreen SPF 30+ to sun-exposed areas, reapply every 2 hours as needed. Call for new or changing lesions.  Staying in the shade or wearing long sleeves, sun glasses (UVA+UVB protection) and wide brim hats (4-inch brim around the entire circumference of the hat) are also recommended for sun protection.      Due to recent changes in healthcare laws, you may see results of your pathology and/or laboratory studies on MyChart before the doctors have had a chance to review them. We understand that in some cases there may be results that are confusing or concerning to you. Please understand that not all results are received at the same time and often the doctors may need to interpret multiple results in order to provide you with the best plan of care or course of treatment. Therefore, we ask that you please give us  2 business days to thoroughly review all your results before contacting the office for clarification. Should we see a critical lab result, you will be contacted sooner.   If You Need Anything After Your Visit  If you have any questions or concerns for your doctor, please call our main line at  [phone] and press option 4 to reach your doctor's medical assistant. If no one answers, please leave a voicemail as directed and we will return your call as soon as possible. Messages left after 4 pm will be answered the following business day.   You may also send us  a message via MyChart. We typically respond to MyChart messages within 1-2 business days.  For prescription refills, please ask your pharmacy to contact our office. Our fax number is [phone].  If you have an urgent issue when the clinic is closed that cannot wait until the next business day, you can page your doctor at the number below.    Please note that while we do our best to be available for urgent issues outside of office hours, we are not available 24/7.   If you have an urgent issue and are unable to reach us , you may choose to seek medical care at your doctor's office, retail clinic, urgent care center, or emergency room.  If you have a medical emergency, please immediately call 911 or go to the emergency department.  Pager Numbers  - Dr. Bary Likes: [phone]  - Dr. Annette Barters: [phone]  - Dr. Felipe Horton: [phone]   In the event of inclement weather, please call our main line at [phone] for an update on the status of any delays or closures.  Dermatology Medication Tips: Please keep the boxes that topical medications come in in order to help keep track of the instructions about where and how to use these. Pharmacies typically print the medication instructions only on the boxes and not directly  on the medication tubes.   If your medication is too expensive, please contact our office at [phone] option 4 or send us  a message through MyChart.   We are unable to tell what your co-pay for medications will be in advance as this is different depending on your insurance coverage. However, we may be able to find a substitute medication at lower cost or fill out paperwork to get insurance to cover a needed  medication.   If a prior authorization is required to get your medication covered by your insurance company, please allow us  1-2 business days to complete this process.  Drug prices often vary depending on where the prescription is filled and some pharmacies may offer cheaper prices.  The website www.goodrx.com contains coupons for medications through different pharmacies. The prices here do not account for what the cost may be with help from insurance (it may be cheaper with your insurance), but the website can give you the price if you did not use any insurance.  - You can print the associated coupon and take it with your prescription to the pharmacy.  - You may also stop by our office during regular business hours and pick up a GoodRx coupon card.  - If you need your prescription sent electronically to a different pharmacy, notify our office through [facility] or by phone at [phone] option 4.     Si Usted Necesita Algo Despus de Su Visita  Tambin puede enviarnos un mensaje a travs de Clinical cytogeneticist. Por lo general respondemos a los mensajes de MyChart en el transcurso de 1 a 2 das hbiles.  Para renovar recetas, por favor pida a su farmacia que se ponga en contacto con nuestra oficina. Franz Jacks de fax es [place] [phone].  Si tiene un asunto urgente cuando la clnica est cerrada y que no puede esperar hasta el siguiente da hbil, puede llamar/localizar a su doctor(a) al nmero que aparece a continuacin.   Por favor, tenga en cuenta que aunque hacemos todo lo posible para estar disponibles para asuntos urgentes fuera del horario de [place], no estamos disponibles las 24 horas del da, los 7 [address]  [address] de la [place].   Si tiene un problema urgente y no puede comunicarse con nosotros, puede optar por buscar atencin mdica  en el consultorio de su doctor(a), en una clnica privada, en un centro de atencin urgente o en una sala de emergencias.  Si tiene Engineer, drilling,  por favor llame inmediatamente al 911 o vaya a la sala de emergencias.  Nmeros de bper  - Dr. Bary Likes: [phone]  - Dra. Annette Barters: [phone]  - Dr. Felipe Horton: [phone]   En caso de inclemencias del tiempo, por favor llame a Lajuan Pila principal al [phone] para una actualizacin sobre el [place] de cualquier retraso o cierre.  Consejos para la medicacin en dermatologa: Por favor, guarde las cajas en las que vienen los medicamentos de uso tpico para ayudarle a seguir las instrucciones sobre dnde y cmo usarlos. Las farmacias generalmente imprimen las instrucciones del medicamento slo en las cajas y no directamente en los tubos del [place].   Si su medicamento es muy caro, por favor, pngase en contacto con Bettyjane Brunet llamando al [phone] y presione la opcin 4 o envenos un mensaje a travs de Clinical cytogeneticist.   No podemos decirle cul ser su copago por los medicamentos por adelantado ya que esto es diferente dependiendo de la cobertura de su seguro. Sin embargo, es posible que podamos encontrar un medicamento  sustituto a Audiological scientist un formulario para que el seguro cubra el medicamento que se considera necesario.   Si se requiere una autorizacin previa para que su compaa de seguros [place] su medicamento, por favor permtanos de 1 a 2 das hbiles para completar este proceso.  Los precios de los medicamentos varan con frecuencia dependiendo del Environmental consultant de dnde se surte la receta y alguna farmacias pueden ofrecer precios ms baratos.  El sitio web www.goodrx.com tiene cupones para medicamentos de Health and safety inspector. Los precios aqu no tienen en cuenta lo que podra costar con la ayuda del seguro (puede ser ms barato con su seguro), pero el sitio web puede darle el precio si no utiliz Tourist information centre manager.  - Puede imprimir el cupn correspondiente y llevarlo con su receta a la farmacia.  - Tambin puede pasar por nuestra oficina durante el horario de atencin  regular y Education officer, museum una tarjeta de cupones de GoodRx.  - Si necesita que su receta se enve electrnicamente a una farmacia diferente, informe a nuestra oficina a travs de MyChart de [place] o por telfono llamando al [phone] y presione la opcin 4.

## 2024-06-18 ENCOUNTER — Encounter: Payer: Self-pay | Admitting: Dermatology

## 2024-06-18 ENCOUNTER — Ambulatory Visit (INDEPENDENT_AMBULATORY_CARE_PROVIDER_SITE_OTHER): Payer: Self-pay | Admitting: Dermatology

## 2024-06-18 DIAGNOSIS — M67442 Ganglion, left hand: Secondary | ICD-10-CM

## 2024-06-18 DIAGNOSIS — M67449 Ganglion, unspecified hand: Secondary | ICD-10-CM

## 2024-06-18 DIAGNOSIS — Z7189 Other specified counseling: Secondary | ICD-10-CM

## 2024-06-18 NOTE — Progress Notes (Signed)
   Follow-Up Visit   Subjective  Deborah Macias is a 63 y.o. female who presents for the following: Spot at right 3rd finger. Dur: couple of months. Red and inflamed 2-3 weeks ago. Has not drained any liquid. Tender at times.  The patient has spots, moles and lesions to be evaluated, some may be new or changing and the patient may have concern these could be cancer.  The following portions of the chart were reviewed this encounter and updated as appropriate: medications, allergies, medical history  Review of Systems:  No other skin or systemic complaints except as noted in HPI or Assessment and Plan.  Objective  Well appearing patient in no apparent distress; mood and affect are within normal limits.  A focused examination was performed of the following areas: Left hand  Relevant physical exam findings are noted in the Assessment and Plan.  Left 3rd Finger, DIP joint/proximal nail fold Solitary, smooth skin colored to translucent papule.    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   If gets larger or becomes more bothersome recommend hand surgeon.  Call for referral to orthopedic hand surgeon.   Advised this condition is also related to her arthritis.   A digital mucous cyst also known as a myxoid cyst or pseudocyst is a ganglion cyst arising from the distal interphalangeal (DIP) joint of the finger or thumb (or, less commonly, toe). The cysts are believed to form from degeneration of connective tissue and are associated with osteoarthritic joints or injury. Although the exact etiology is unknown, it is likely that a small tear forms in a joint capsule or tendon sheath, allowing extravasation of synovial fluid into the adjacent tissue. When the fluid reacts with local tissue, it becomes more gelatinous and a cyst wall forms. With any treatment, there is a high rate of recurrence.    Treatment options include: - Puncture / Incision & Drainage (I&D) - Intralesional steroid injection - Intralesional Sclerosant injection (Asclera/ Polidocanol) - Intralesional steroid + sclerosant - Corticosteroid tape - Cryosurgery - Laser (CO2) - Infrared photocoagulation - Excision / Surgery  COUNSELING AND COORDINATION OF CARE   Pt decided since asymptomatic to just observe for now. If becomes symptomatic or other changes, we can refer to Orthopedic Hand surgeon.  Return if symptoms worsen or fail to improve.  I, Jill Parcell, CMA, am acting as scribe for Alm Rhyme, MD.   Documentation: I have reviewed the above documentation for accuracy and completeness, and I agree with the above.  Alm Rhyme, MD

## 2024-06-18 NOTE — Patient Instructions (Addendum)
 A digital mucous cyst also known as a myxoid cyst or pseudocyst is a ganglion cyst arising from the distal interphalangeal (DIP) joint of the finger or thumb (or, less commonly, toe). The cysts are believed to form from degeneration of connective tissue and are associated with osteoarthritic joints or injury. Although the exact etiology is unknown, it is likely that a small tear forms in a joint capsule or tendon sheath, allowing extravasation of synovial fluid into the adjacent tissue. When the fluid reacts with local tissue, it becomes more gelatinous and a cyst wall forms. With any treatment, there is a high rate of recurrence.   Treatment options include: - Puncture / Incision & Drainage (I&D) - Intralesional steroid injection - Intralesional Sclerosant injection (Asclera/ Polidocanol) - Intralesional steroid + sclerosant - Corticosteroid tape - Cryosurgery - Laser (CO2) - Infrared photocoagulation - Excision / Surgery    Call our office if you would like referral to orthopedic hand surgeon.   Recommend daily broad spectrum sunscreen SPF 30+ to sun-exposed areas, reapply every 2 hours as needed. Call for new or changing lesions.  Staying in the shade or wearing long sleeves, sun glasses (UVA+UVB protection) and wide brim hats (4-inch brim around the entire circumference of the hat) are also recommended for sun protection.    Due to recent changes in healthcare laws, you may see results of your pathology and/or laboratory studies on MyChart before the doctors have had a chance to review them. We understand that in some cases there may be results that are confusing or concerning to you. Please understand that not all results are received at the same time and often the doctors may need to interpret multiple results in order to provide you with the best plan of care or course of treatment. Therefore, we ask that you please give us  2 business days to thoroughly review all your results before  contacting the office for clarification. Should we see a critical lab result, you will be contacted sooner.   If You Need Anything After Your Visit  If you have any questions or concerns for your doctor, please call our main line at [phone] and press option 4 to reach your doctor's medical assistant. If no one answers, please leave a voicemail as directed and we will return your call as soon as possible. Messages left after 4 pm will be answered the following business day.   You may also send us  a message via MyChart. We typically respond to MyChart messages within 1-2 business days.  For prescription refills, please ask your pharmacy to contact our office. Our fax number is [phone].  If you have an urgent issue when the clinic is closed that cannot wait until the next business day, you can page your doctor at the number below.    Please note that while we do our best to be available for urgent issues outside of office hours, we are not available 24/7.   If you have an urgent issue and are unable to reach us , you may choose to seek medical care at your doctor's office, retail clinic, urgent care center, or emergency room.  If you have a medical emergency, please immediately call 911 or go to the emergency department.  Pager Numbers  - Dr. Hester: [phone]  - Dr. Jackquline: [phone]  - Dr. Claudene: [phone]   In the event of inclement weather, please call our main line at [phone] for an update on the status of any delays or closures.  Dermatology  Medication Tips: Please keep the boxes that topical medications come in in order to help keep track of the instructions about where and how to use these. Pharmacies typically print the medication instructions only on the boxes and not directly on the medication tubes.   If your medication is too expensive, please contact our office at [phone] option 4 or send us  a message through MyChart.   We are unable to tell  what your co-pay for medications will be in advance as this is different depending on your insurance coverage. However, we may be able to find a substitute medication at lower cost or fill out paperwork to get insurance to cover a needed medication.   If a prior authorization is required to get your medication covered by your insurance company, please allow us  1-2 business days to complete this process.  Drug prices often vary depending on where the prescription is filled and some pharmacies may offer cheaper prices.  The website www.goodrx.com contains coupons for medications through different pharmacies. The prices here do not account for what the cost may be with help from insurance (it may be cheaper with your insurance), but the website can give you the price if you did not use any insurance.  - You can print the associated coupon and take it with your prescription to the pharmacy.  - You may also stop by our office during regular business hours and pick up a GoodRx coupon card.  - If you need your prescription sent electronically to a different pharmacy, notify our office through [facility] or by phone at [phone] option 4.     Si Usted Necesita Algo Despus de Su Visita  Tambin puede enviarnos un mensaje a travs de Clinical cytogeneticist. Por lo general respondemos a los mensajes de MyChart en el transcurso de 1 a 2 das hbiles.  Para renovar recetas, por favor pida a su farmacia que se ponga en contacto con nuestra oficina. Randi lakes de fax es [place] [phone].  Si tiene un asunto urgente cuando la clnica est cerrada y que no puede esperar hasta el siguiente da hbil, puede llamar/localizar a su doctor(a) al nmero que aparece a continuacin.   Por favor, tenga en cuenta que aunque hacemos todo lo posible para estar disponibles para asuntos urgentes fuera del horario de [place], no estamos disponibles las 24 horas del da, los 7 [address]  [address] de la [place].   Si tiene un problema  urgente y no puede comunicarse con nosotros, puede optar por buscar atencin mdica  en el consultorio de su doctor(a), en una clnica privada, en un centro de atencin urgente o en una sala de emergencias.  Si tiene Engineer, drilling, por favor llame inmediatamente al 911 o vaya a la sala de emergencias.  Nmeros de bper  - Dr. Hester: [phone]  - Dra. Jackquline: [phone]  - Dr. Claudene: [phone]   En caso de inclemencias del tiempo, por favor llame a landry capes principal al [phone] para una actualizacin sobre el [place] de cualquier retraso o cierre.  Consejos para la medicacin en dermatologa: Por favor, guarde las cajas en las que vienen los medicamentos de uso tpico para ayudarle a seguir las instrucciones sobre dnde y cmo usarlos. Las farmacias generalmente imprimen las instrucciones del medicamento slo en las cajas y no directamente en los tubos del [place].   Si su medicamento es muy caro, por favor, pngase en contacto con landry rieger llamando al [phone] y presione la opcin 4 o envenos un  mensaje a travs de MyChart.   No podemos decirle cul ser su copago por los medicamentos por adelantado ya que esto es diferente dependiendo de la cobertura de su seguro. Sin embargo, es posible que podamos encontrar un medicamento sustituto a Audiological scientist un formulario para que el seguro cubra el medicamento que se considera necesario.   Si se requiere una autorizacin previa para que su compaa de seguros malta su medicamento, por favor permtanos de 1 a 2 das hbiles para completar este proceso.  Los precios de los medicamentos varan con frecuencia dependiendo del Environmental consultant de dnde se surte la receta y alguna farmacias pueden ofrecer precios ms baratos.  El sitio web www.goodrx.com tiene cupones para medicamentos de Health and safety inspector. Los precios aqu no tienen en cuenta lo que podra costar con la ayuda del seguro (puede ser ms barato  con su seguro), pero el sitio web puede darle el precio si no utiliz Tourist information centre manager.  - Puede imprimir el cupn correspondiente y llevarlo con su receta a la farmacia.  - Tambin puede pasar por nuestra oficina durante el horario de atencin regular y Education officer, museum una tarjeta de cupones de GoodRx.  - Si necesita que su receta se enve electrnicamente a una farmacia diferente, informe a nuestra oficina a travs de MyChart de [place] o por telfono llamando al [phone] y presione la opcin 4.

## 2024-07-01 ENCOUNTER — Telehealth: Payer: Self-pay

## 2024-07-01 DIAGNOSIS — M67449 Ganglion, unspecified hand: Secondary | ICD-10-CM

## 2024-07-01 NOTE — Addendum Note (Signed)
 Addended by: TERESA PALMA R on: 07/01/2024 06:01 PM   Modules accepted: Orders

## 2024-07-01 NOTE — Telephone Encounter (Signed)
 Patient called regarding the digital mucous cyst documented at the last office visit. She did leave nurse voicemail over the weekend that this is now bothersome and grew in size also causing a divot in her nail bed.  She wanted to know okay to trim or clip the nail? She would also like referral to orthopedic hand surgeon. Okay to send?

## 2024-07-01 NOTE — Telephone Encounter (Signed)
 Patient advised of information per Dr. Hester and referral sent to [facility]. aw

## 2024-09-26 ENCOUNTER — Telehealth: Payer: Self-pay

## 2024-09-26 NOTE — Telephone Encounter (Signed)
 Please see office visit note from [facility] in media.
